# Patient Record
Sex: Female | Born: 1960 | Race: Black or African American | Hispanic: No | Smoking: Current every day smoker
Health system: Southern US, Community
[De-identification: ages and names within clinical notes are randomized; demographics above are authoritative.]

## PROBLEM LIST (undated history)

## (undated) DIAGNOSIS — I10 Essential (primary) hypertension: Secondary | ICD-10-CM

## (undated) DIAGNOSIS — R935 Abnormal findings on diagnostic imaging of other abdominal regions, including retroperitoneum: Secondary | ICD-10-CM

## (undated) DIAGNOSIS — K760 Fatty (change of) liver, not elsewhere classified: Secondary | ICD-10-CM

## (undated) DIAGNOSIS — L409 Psoriasis, unspecified: Secondary | ICD-10-CM

## (undated) DIAGNOSIS — Z9289 Personal history of other medical treatment: Secondary | ICD-10-CM

## (undated) DIAGNOSIS — J189 Pneumonia, unspecified organism: Secondary | ICD-10-CM

## (undated) DIAGNOSIS — N6009 Solitary cyst of unspecified breast: Secondary | ICD-10-CM

## (undated) DIAGNOSIS — K7689 Other specified diseases of liver: Secondary | ICD-10-CM

## (undated) DIAGNOSIS — R011 Cardiac murmur, unspecified: Secondary | ICD-10-CM

## (undated) DIAGNOSIS — K219 Gastro-esophageal reflux disease without esophagitis: Secondary | ICD-10-CM

## (undated) DIAGNOSIS — E119 Type 2 diabetes mellitus without complications: Secondary | ICD-10-CM

## (undated) DIAGNOSIS — N6002 Solitary cyst of left breast: Secondary | ICD-10-CM

## (undated) DIAGNOSIS — M1990 Unspecified osteoarthritis, unspecified site: Secondary | ICD-10-CM

## (undated) DIAGNOSIS — N6001 Solitary cyst of right breast: Secondary | ICD-10-CM

## (undated) DIAGNOSIS — E669 Obesity, unspecified: Secondary | ICD-10-CM

## (undated) HISTORY — DX: Fatty (change of) liver, not elsewhere classified: K76.0

## (undated) HISTORY — DX: Abnormal findings on diagnostic imaging of other abdominal regions, including retroperitoneum: R93.5

## (undated) HISTORY — DX: Solitary cyst of unspecified breast: N60.09

## (undated) HISTORY — DX: Unspecified osteoarthritis, unspecified site: M19.90

## (undated) HISTORY — DX: Type 2 diabetes mellitus without complications: E11.9

## (undated) HISTORY — DX: Solitary cyst of right breast: N60.02

## (undated) HISTORY — PX: COLONOSCOPY: SHX174

## (undated) HISTORY — DX: Pneumonia, unspecified organism: J18.9

## (undated) HISTORY — DX: Other specified diseases of liver: K76.89

## (undated) HISTORY — DX: Solitary cyst of left breast: N60.01

## (undated) HISTORY — DX: Gastro-esophageal reflux disease without esophagitis: K21.9

## (undated) HISTORY — DX: Psoriasis, unspecified: L40.9

## (undated) HISTORY — DX: Obesity, unspecified: E66.9

## (undated) HISTORY — PX: TONSILLECTOMY: SUR1361

---

## 1985-03-07 DIAGNOSIS — Z9289 Personal history of other medical treatment: Secondary | ICD-10-CM

## 1985-03-07 HISTORY — DX: Personal history of other medical treatment: Z92.89

## 1985-03-07 HISTORY — PX: OTHER SURGICAL HISTORY: SHX169

## 2008-07-10 ENCOUNTER — Emergency Department (HOSPITAL_COMMUNITY): Admission: EM | Admit: 2008-07-10 | Discharge: 2008-07-11 | Payer: Self-pay | Admitting: Emergency Medicine

## 2009-03-07 HISTORY — PX: ABDOMINAL HYSTERECTOMY: SHX81

## 2010-01-13 ENCOUNTER — Other Ambulatory Visit: Admission: RE | Admit: 2010-01-13 | Discharge: 2010-01-13 | Payer: Self-pay | Admitting: Obstetrics and Gynecology

## 2010-01-19 LAB — HM PAP SMEAR

## 2010-03-03 ENCOUNTER — Encounter (INDEPENDENT_AMBULATORY_CARE_PROVIDER_SITE_OTHER): Payer: Self-pay | Admitting: Obstetrics and Gynecology

## 2010-03-03 ENCOUNTER — Inpatient Hospital Stay (HOSPITAL_COMMUNITY)
Admission: RE | Admit: 2010-03-03 | Discharge: 2010-03-05 | Payer: Self-pay | Source: Home / Self Care | Attending: Obstetrics and Gynecology | Admitting: Obstetrics and Gynecology

## 2010-05-17 LAB — CBC
HCT: 35.9 % — ABNORMAL LOW (ref 36.0–46.0)
HCT: 37.9 % (ref 36.0–46.0)
Hemoglobin: 11.5 g/dL — ABNORMAL LOW (ref 12.0–15.0)
Hemoglobin: 12.7 g/dL (ref 12.0–15.0)
MCH: 30.4 pg (ref 26.0–34.0)
MCH: 30.9 pg (ref 26.0–34.0)
MCHC: 32 g/dL (ref 30.0–36.0)
MCV: 92.2 fL (ref 78.0–100.0)
RBC: 4.11 MIL/uL (ref 3.87–5.11)

## 2010-05-17 LAB — BASIC METABOLIC PANEL
CO2: 27 mEq/L (ref 19–32)
Chloride: 105 mEq/L (ref 96–112)
GFR calc Af Amer: >60 mL/min (ref >60)
Sodium: 140 mEq/L (ref 135–145)

## 2010-06-15 LAB — POCT I-STAT, CHEM 8
BUN: 7 mg/dL (ref 6–23)
Calcium, Ion: 1.16 mmol/L (ref 1.12–1.32)
Creatinine, Ser: 1 mg/dL (ref 0.4–1.2)
Sodium: 140 mEq/L (ref 135–145)
TCO2: 24 mmol/L (ref 0–100)

## 2010-06-15 LAB — URINALYSIS, ROUTINE W REFLEX MICROSCOPIC
Glucose, UA: NEGATIVE mg/dL
Hgb urine dipstick: NEGATIVE
Ketones, ur: NEGATIVE mg/dL
Protein, ur: NEGATIVE mg/dL

## 2012-05-23 ENCOUNTER — Other Ambulatory Visit: Payer: Self-pay | Admitting: Gastroenterology

## 2012-06-07 ENCOUNTER — Encounter (HOSPITAL_COMMUNITY): Payer: Self-pay | Admitting: *Deleted

## 2012-06-11 ENCOUNTER — Other Ambulatory Visit: Payer: Self-pay | Admitting: Internal Medicine

## 2012-06-11 DIAGNOSIS — K219 Gastro-esophageal reflux disease without esophagitis: Secondary | ICD-10-CM

## 2012-06-13 ENCOUNTER — Ambulatory Visit
Admission: RE | Admit: 2012-06-13 | Discharge: 2012-06-13 | Disposition: A | Discharge status: Home / Self Care | Payer: 59 | Source: Ambulatory Visit | Attending: Internal Medicine | Admitting: Internal Medicine

## 2012-06-13 ENCOUNTER — Encounter (HOSPITAL_COMMUNITY): Payer: Self-pay | Admitting: Pharmacy Technician

## 2012-06-13 DIAGNOSIS — K219 Gastro-esophageal reflux disease without esophagitis: Secondary | ICD-10-CM

## 2012-06-26 ENCOUNTER — Ambulatory Visit (HOSPITAL_COMMUNITY): Payer: 59 | Admitting: Anesthesiology

## 2012-06-26 ENCOUNTER — Encounter (HOSPITAL_COMMUNITY): Payer: Self-pay | Admitting: Anesthesiology

## 2012-06-26 ENCOUNTER — Ambulatory Visit (HOSPITAL_COMMUNITY)
Admission: RE | Admit: 2012-06-26 | Discharge: 2012-06-26 | Disposition: A | Discharge status: Home / Self Care | Payer: 59 | Source: Ambulatory Visit | Attending: Gastroenterology | Admitting: Gastroenterology

## 2012-06-26 ENCOUNTER — Encounter (HOSPITAL_COMMUNITY): Payer: Self-pay | Admitting: *Deleted

## 2012-06-26 ENCOUNTER — Encounter (HOSPITAL_COMMUNITY)
Admission: RE | Disposition: A | Discharge status: Home / Self Care | Payer: Self-pay | Source: Ambulatory Visit | Attending: Gastroenterology

## 2012-06-26 DIAGNOSIS — K219 Gastro-esophageal reflux disease without esophagitis: Secondary | ICD-10-CM | POA: Insufficient documentation

## 2012-06-26 DIAGNOSIS — K5730 Diverticulosis of large intestine without perforation or abscess without bleeding: Secondary | ICD-10-CM | POA: Insufficient documentation

## 2012-06-26 DIAGNOSIS — F17200 Nicotine dependence, unspecified, uncomplicated: Secondary | ICD-10-CM | POA: Insufficient documentation

## 2012-06-26 DIAGNOSIS — Z1211 Encounter for screening for malignant neoplasm of colon: Secondary | ICD-10-CM | POA: Insufficient documentation

## 2012-06-26 DIAGNOSIS — L408 Other psoriasis: Secondary | ICD-10-CM | POA: Insufficient documentation

## 2012-06-26 HISTORY — DX: Essential (primary) hypertension: I10

## 2012-06-26 HISTORY — PX: COLONOSCOPY WITH PROPOFOL: SHX5780

## 2012-06-26 HISTORY — DX: Cardiac murmur, unspecified: R01.1

## 2012-06-26 HISTORY — DX: Personal history of other medical treatment: Z92.89

## 2012-06-26 SURGERY — COLONOSCOPY WITH PROPOFOL
Anesthesia: Monitor Anesthesia Care

## 2012-06-26 MED ORDER — MIDAZOLAM HCL 5 MG/5ML IJ SOLN
INTRAMUSCULAR | Status: DC | PRN
  Administered 2012-06-26: 2 mg via INTRAVENOUS

## 2012-06-26 MED ORDER — LACTATED RINGERS IV SOLN
INTRAVENOUS | Status: DC
  Administered 2012-06-26: 1000 mL via INTRAVENOUS

## 2012-06-26 MED ORDER — PROPOFOL INFUSION 10 MG/ML OPTIME
INTRAVENOUS | Status: DC | PRN
  Administered 2012-06-26: 140 ug/kg/min via INTRAVENOUS

## 2012-06-26 MED ORDER — SODIUM CHLORIDE 0.9 % IV SOLN: INTRAVENOUS | Status: DC

## 2012-06-26 SURGICAL SUPPLY — 21 items

## 2012-06-26 NOTE — Anesthesia Preprocedure Evaluation (Addendum)
Anesthesia Evaluation  Patient identified by MRN, date of birth, ID band Patient awake    Reviewed: Allergy & Precautions, H&P , NPO status , Patient's Chart, lab work & pertinent test results  Airway Mallampati: II TM Distance: >3 FB Neck ROM: full    Dental  (+) Dental Advisory Given and Missing Missing a couple of lateral front teeth.:   Pulmonary neg pulmonary ROS, Current Smoker,  breath sounds clear to auscultation  Pulmonary exam normal       Cardiovascular Exercise Tolerance: Good hypertension, Pt. on medications negative cardio ROS  Rhythm:regular Rate:Normal     Neuro/Psych negative neurological ROS  negative psych ROS   GI/Hepatic negative GI ROS, Neg liver ROS,   Endo/Other  negative endocrine ROS  Renal/GU negative Renal ROS  negative genitourinary   Musculoskeletal   Abdominal   Peds  Hematology negative hematology ROS (+)   Anesthesia Other Findings   Reproductive/Obstetrics negative OB ROS                          Anesthesia Physical Anesthesia Plan  ASA: II  Anesthesia Plan: MAC   Post-op Pain Management:    Induction:   Airway Management Planned: Simple Face Mask  Additional Equipment:   Intra-op Plan:   Post-operative Plan:   Informed Consent: I have reviewed the patients History and Physical, chart, labs and discussed the procedure including the risks, benefits and alternatives for the proposed anesthesia with the patient or authorized representative who has indicated his/her understanding and acceptance.   Dental Advisory Given  Plan Discussed with: CRNA and Surgeon  Anesthesia Plan Comments:         Anesthesia Quick Evaluation

## 2012-06-26 NOTE — Op Note (Signed)
Procedure: Screening colonoscopy  Endoscopist: Martin Johnson  Premedication: Propofol administered by anesthesia  Procedure: The patient was placed in the left lateral decubitus position. Anal inspection and digital rectal exam were normal. The Pentax pediatric colonoscope was introduced into the rectum and advanced to the cecum. A normal-appearing ileocecal valve and appendiceal orifice were identified. Colonic preparation for the exam today was good.  Rectum. Normal. Retroflex view of the distal rectum normal.  Sigmoid colon and descending colon. Left colonic diverticulosis.  Splenic flexure. Normal.  Transverse colon. Normal.  Hepatic flexure. Normal.  Ascending colon. Normal.  Cecum and ileocecal valve. Normal.  Assessment: Normal screening proctocolonoscopy to the cecum.  Recommendations: Schedule repeat screening colonoscopy in 10 years. 

## 2012-06-26 NOTE — Anesthesia Postprocedure Evaluation (Signed)
  Anesthesia Post-op Note  Patient: Judy Fischer  Procedure(s) Performed: Procedure(s) (LRB): COLONOSCOPY WITH PROPOFOL (N/A)  Patient Location: PACU  Anesthesia Type: MAC  Level of Consciousness: awake and alert   Airway and Oxygen Therapy: Patient Spontanous Breathing  Post-op Pain: mild  Post-op Assessment: Post-op Vital signs reviewed, Patient's Cardiovascular Status Stable, Respiratory Function Stable, Patent Airway and No signs of Nausea or vomiting  Last Vitals:  Filed Vitals:   06/26/12 1258  BP: 156/80  Pulse:   Temp:   Resp: 15    Post-op Vital Signs: stable   Complications: No apparent anesthesia complications

## 2012-06-26 NOTE — Transfer of Care (Signed)
Immediate Anesthesia Transfer of Care Note  Patient: Judy Fischer  Procedure(s) Performed: Procedure(s): COLONOSCOPY WITH PROPOFOL (N/A)  Patient Location: PACU  Anesthesia Type:MAC  Level of Consciousness: awake, alert , oriented and patient cooperative  Airway & Oxygen Therapy: Patient Spontanous Breathing and Patient connected to nasal cannula oxygen  Post-op Assessment: Report given to PACU RN and Post -op Vital signs reviewed and stable  Post vital signs: Reviewed and stable  Complications: No apparent anesthesia complications

## 2012-06-26 NOTE — Preoperative (Signed)
Beta Blockers   Reason not to administer Beta Blockers:Not Applicable 

## 2012-06-26 NOTE — H&P (Signed)
Procedure: Screening colonoscopy.  History: The patient is a 52 year old female born Aug 16, 1960. The patient is scheduled to undergo her first screening colonoscopy with polypectomy to prevent colon cancer.  Chronic medications: Hydrochlorothiazide. Buspirone.  Past medical history: Gastroesophageal reflux. Psoriasis. Laparotomy for tubal pregnancy. Total abdominal hysterectomy. Bilateral salpingo-oophorectomy.  Medication allergies: None  Habits: The patient smokes one pack of cigarettes per day and consumes alcohol in moderation.  Exam: The patient is alert and lying comfortably on the endoscopy stretcher. Lungs are clear to auscultation. Cardiac exam reveals a regular rhythm. Abdomen is soft, flat, and nontender to palpation.  Plan: Proceed with screening colonoscopy using propofol sedation.

## 2012-06-27 ENCOUNTER — Encounter (HOSPITAL_COMMUNITY): Payer: Self-pay | Admitting: Gastroenterology

## 2012-07-12 ENCOUNTER — Other Ambulatory Visit: Payer: Self-pay | Admitting: Internal Medicine

## 2012-07-12 DIAGNOSIS — R1011 Right upper quadrant pain: Secondary | ICD-10-CM

## 2012-07-16 ENCOUNTER — Ambulatory Visit
Admission: RE | Admit: 2012-07-16 | Discharge: 2012-07-16 | Disposition: A | Discharge status: Home / Self Care | Payer: 59 | Source: Ambulatory Visit | Attending: Internal Medicine | Admitting: Internal Medicine

## 2012-07-16 DIAGNOSIS — R1011 Right upper quadrant pain: Secondary | ICD-10-CM

## 2012-07-19 ENCOUNTER — Other Ambulatory Visit: Payer: 59

## 2012-08-06 ENCOUNTER — Other Ambulatory Visit: Payer: Self-pay | Admitting: Gastroenterology

## 2012-08-06 DIAGNOSIS — R109 Unspecified abdominal pain: Secondary | ICD-10-CM

## 2012-08-13 ENCOUNTER — Ambulatory Visit
Admission: RE | Admit: 2012-08-13 | Discharge: 2012-08-13 | Disposition: A | Discharge status: Home / Self Care | Payer: 59 | Source: Ambulatory Visit | Attending: Gastroenterology | Admitting: Gastroenterology

## 2012-08-13 DIAGNOSIS — R935 Abnormal findings on diagnostic imaging of other abdominal regions, including retroperitoneum: Secondary | ICD-10-CM

## 2012-08-13 DIAGNOSIS — R109 Unspecified abdominal pain: Secondary | ICD-10-CM

## 2012-08-13 HISTORY — DX: Abnormal findings on diagnostic imaging of other abdominal regions, including retroperitoneum: R93.5

## 2012-08-13 MED ORDER — GADOXETATE DISODIUM 0.25 MMOL/ML IV SOLN
10.0000 mL | Freq: Once | INTRAVENOUS | Status: AC | PRN
  Administered 2012-08-13: 10 mL via INTRAVENOUS

## 2013-01-04 ENCOUNTER — Other Ambulatory Visit: Payer: Self-pay

## 2013-01-04 DIAGNOSIS — Z1231 Encounter for screening mammogram for malignant neoplasm of breast: Secondary | ICD-10-CM

## 2013-02-04 ENCOUNTER — Ambulatory Visit: Payer: 59

## 2013-02-04 ENCOUNTER — Ambulatory Visit
Admission: RE | Admit: 2013-02-04 | Discharge: 2013-02-04 | Disposition: A | Discharge status: Home / Self Care | Payer: 59 | Source: Ambulatory Visit

## 2013-02-04 DIAGNOSIS — Z1231 Encounter for screening mammogram for malignant neoplasm of breast: Secondary | ICD-10-CM

## 2013-02-05 ENCOUNTER — Other Ambulatory Visit: Payer: Self-pay | Admitting: Internal Medicine

## 2013-02-05 DIAGNOSIS — R928 Other abnormal and inconclusive findings on diagnostic imaging of breast: Secondary | ICD-10-CM

## 2013-02-07 ENCOUNTER — Other Ambulatory Visit: Payer: Self-pay | Admitting: Internal Medicine

## 2013-02-07 ENCOUNTER — Ambulatory Visit
Admission: RE | Admit: 2013-02-07 | Discharge: 2013-02-07 | Disposition: A | Discharge status: Home / Self Care | Payer: 59 | Source: Ambulatory Visit | Attending: Internal Medicine | Admitting: Internal Medicine

## 2013-02-07 DIAGNOSIS — M25561 Pain in right knee: Secondary | ICD-10-CM

## 2013-02-07 DIAGNOSIS — R928 Other abnormal and inconclusive findings on diagnostic imaging of breast: Secondary | ICD-10-CM

## 2013-02-14 ENCOUNTER — Other Ambulatory Visit: Payer: 59

## 2013-06-05 ENCOUNTER — Other Ambulatory Visit: Payer: Self-pay | Admitting: Internal Medicine

## 2013-06-05 ENCOUNTER — Ambulatory Visit
Admission: RE | Admit: 2013-06-05 | Discharge: 2013-06-05 | Disposition: A | Discharge status: Home / Self Care | Payer: 59 | Source: Ambulatory Visit | Attending: Internal Medicine | Admitting: Internal Medicine

## 2013-06-05 DIAGNOSIS — R062 Wheezing: Secondary | ICD-10-CM

## 2013-07-04 ENCOUNTER — Encounter: Payer: Self-pay | Admitting: Internal Medicine

## 2013-07-04 ENCOUNTER — Other Ambulatory Visit: Payer: Self-pay | Admitting: Gastroenterology

## 2013-07-30 ENCOUNTER — Encounter: Payer: 59 | Attending: Internal Medicine

## 2013-07-30 VITALS — Ht 63.0 in | Wt 250.9 lb

## 2013-07-30 DIAGNOSIS — E119 Type 2 diabetes mellitus without complications: Secondary | ICD-10-CM | POA: Insufficient documentation

## 2013-07-30 DIAGNOSIS — Z713 Dietary counseling and surveillance: Secondary | ICD-10-CM | POA: Insufficient documentation

## 2013-08-06 ENCOUNTER — Encounter: Payer: 59 | Attending: Internal Medicine

## 2013-08-06 DIAGNOSIS — Z713 Dietary counseling and surveillance: Secondary | ICD-10-CM | POA: Insufficient documentation

## 2013-08-06 DIAGNOSIS — E119 Type 2 diabetes mellitus without complications: Secondary | ICD-10-CM | POA: Insufficient documentation

## 2013-08-06 NOTE — Progress Notes (Signed)
Patient was seen on 07/30/13 for the first of a series of three diabetes self-management courses at the [facility].  Current HbA1c: 10.6%  The following learning objectives were met by the patient during this class:  Describe diabetes  State some common risk factors for diabetes  Defines the role of glucose and insulin  Identifies type of diabetes and pathophysiology  Describe the relationship between diabetes and cardiovascular risk  State the members of the Healthcare Team  States the rationale for glucose monitoring  State when to test glucose  State their individual Target Range  State the importance of logging glucose readings  Describe how to interpret glucose readings  Identifies A1C target  Explain the correlation between A1c and eAG values  State symptoms and treatment of high blood glucose  State symptoms and treatment of low blood glucose  Explain proper technique for glucose testing  Identifies proper sharps disposal  Handouts given during class include:  Living Well with Diabetes book  Carb Counting and Meal Planning book  Meal Plan Card  Carbohydrate guide  Meal planning worksheet  Low Sodium Flavoring Tips  The diabetes portion plate  J0Z to eAG Conversion Chart  Diabetes Medications  Diabetes Recommended Care Schedule  Support Group  Diabetes Success Plan  Core Class Satisfaction Survey  Follow-Up Plan:  Attend core 2

## 2013-08-07 NOTE — Progress Notes (Signed)

## 2013-08-13 DIAGNOSIS — E119 Type 2 diabetes mellitus without complications: Secondary | ICD-10-CM

## 2013-08-13 NOTE — Progress Notes (Signed)
Patient was seen on 08/13/2013 for the third of a series of three diabetes self-management courses at the [facility]. The following learning objectives were met by the patient during this class:    State the amount of activity recommended for healthy living   Describe activities suitable for individual needs   Identify ways to regularly incorporate activity into daily life   Identify barriers to activity and ways to over come these barriers  Identify diabetes medications being personally used and their primary action for lowering glucose and possible side effects   Describe role of stress on blood glucose and develop strategies to address psychosocial issues   Identify diabetes complications and ways to prevent them  Explain how to manage diabetes during illness   Evaluate success in meeting personal goal   Establish 2-3 goals that they will plan to diligently work on until they return for the  11-monthfollow-up visit  Goals:  Follow Diabetes Meal Plan as instructed  Aim for 15-30 mins of physical activity daily as tolerated  Bring food record and glucose log to your follow up visit  Your patient has established the following 4 month goals in their individualized success plan: I will increase my activity level at least 3 days a week  Your patient has identified these potential barriers to change:  None stated  Your patient has identified their diabetes self-care support plan as  [facility]  Family Support  Plan:  Attend Core 4 in 4 months

## 2013-11-04 IMAGING — US US ABDOMEN LIMITED
1 series · 14 of 25 positions shown · non-contrast
Comparison: None.

CLINICAL DATA: Right upper quadrant pain.

LIMITED ABDOMINAL ULTRASOUND - RIGHT UPPER QUADRANT

[Series 1: us abdomen limited · 0.35mm/px · 14 of 57 slices shown]
[im 1/57]
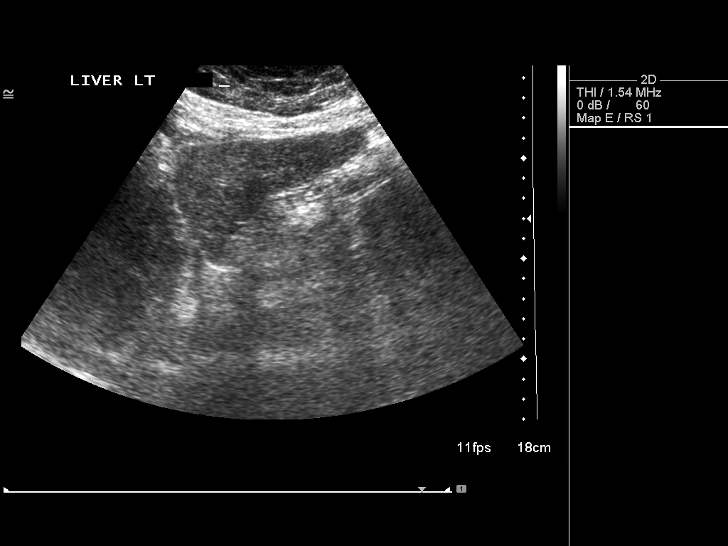
[im 5/57]
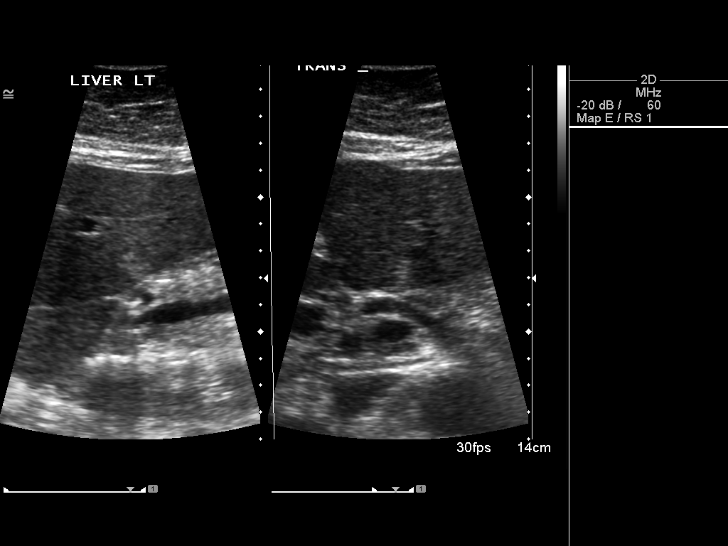
[im 10/57]
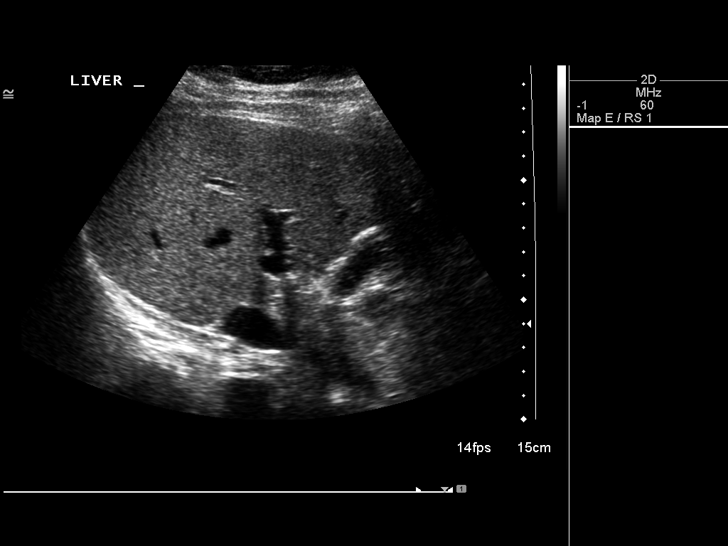
[im 15/57]
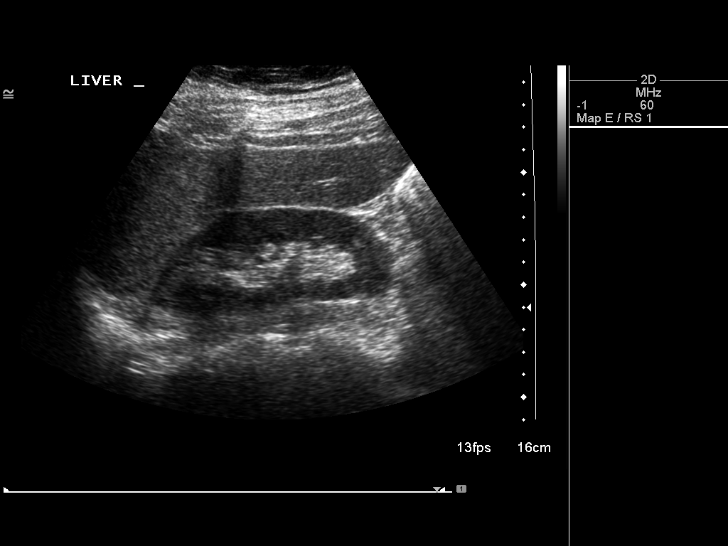
[im 19/57]
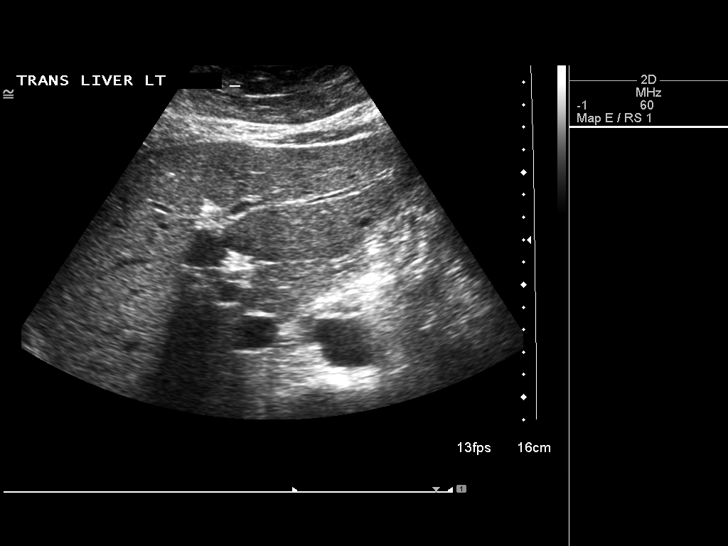
[im 22/57]
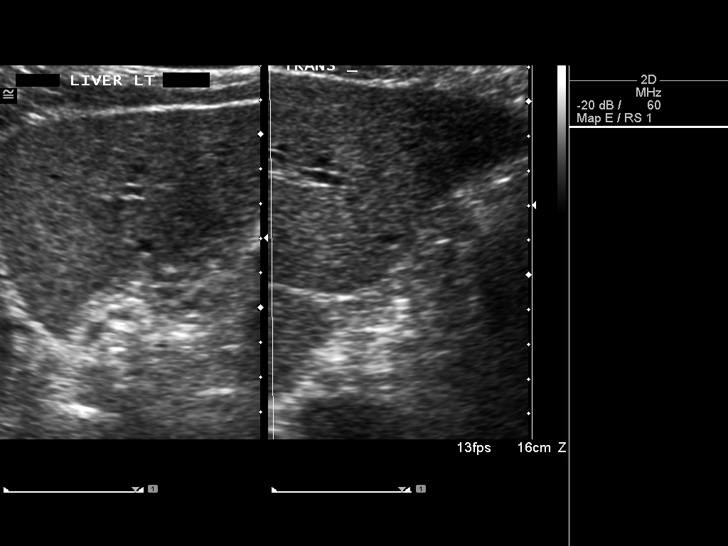
[im 26/57]
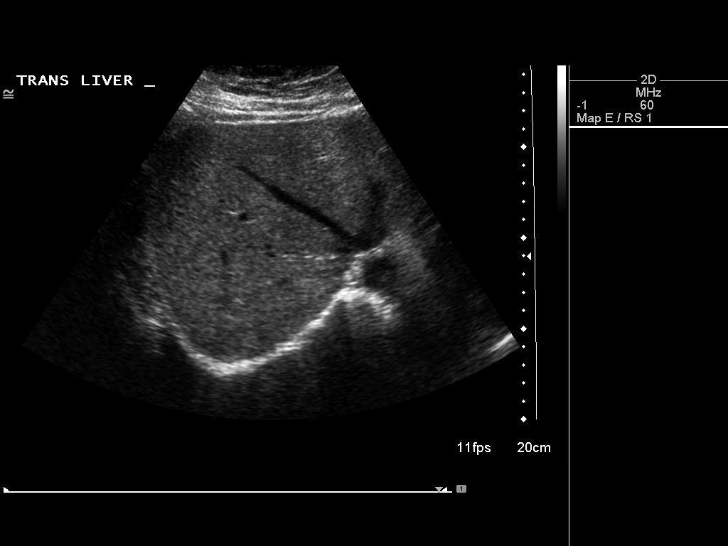
[im 31/57]
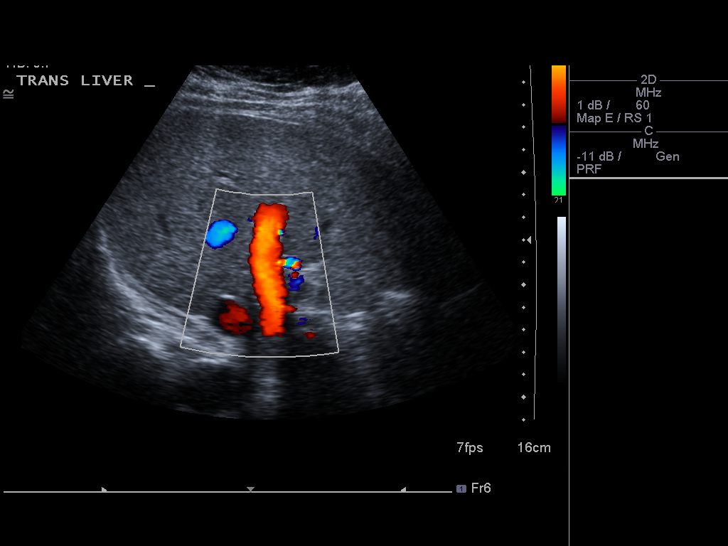
[im 36/57]
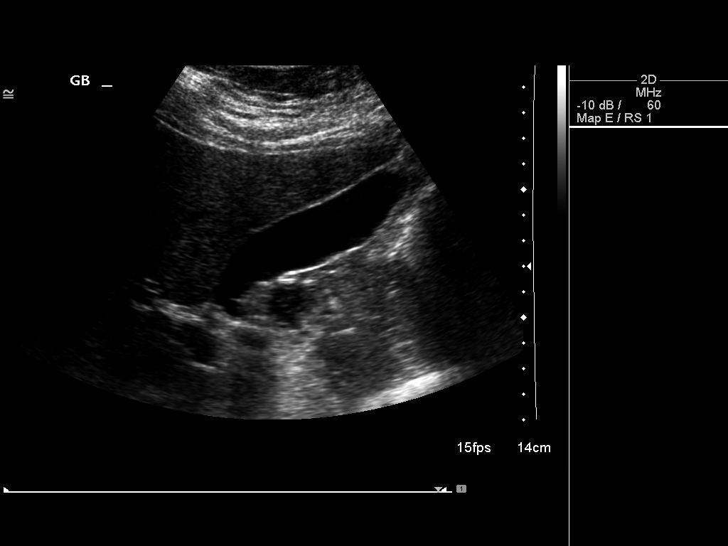
[im 38/57]
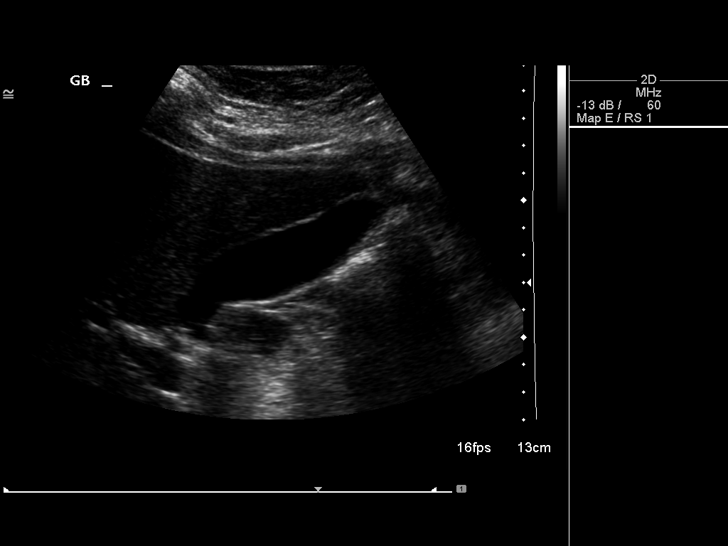
[im 43/57]
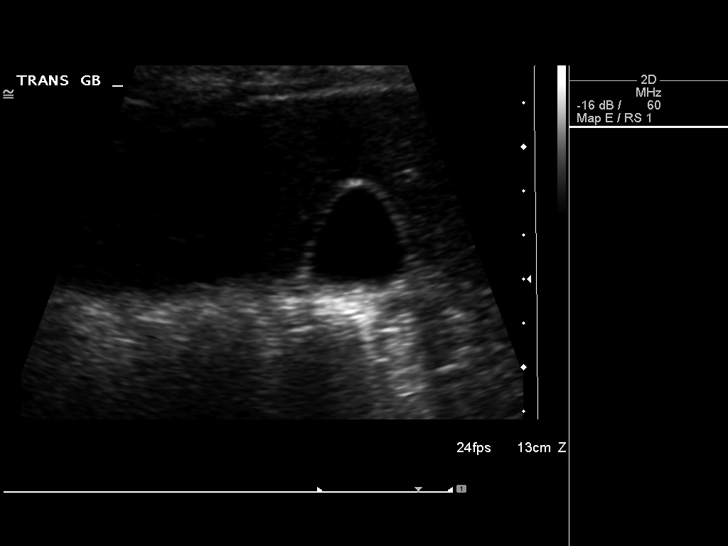
[im 47/57]
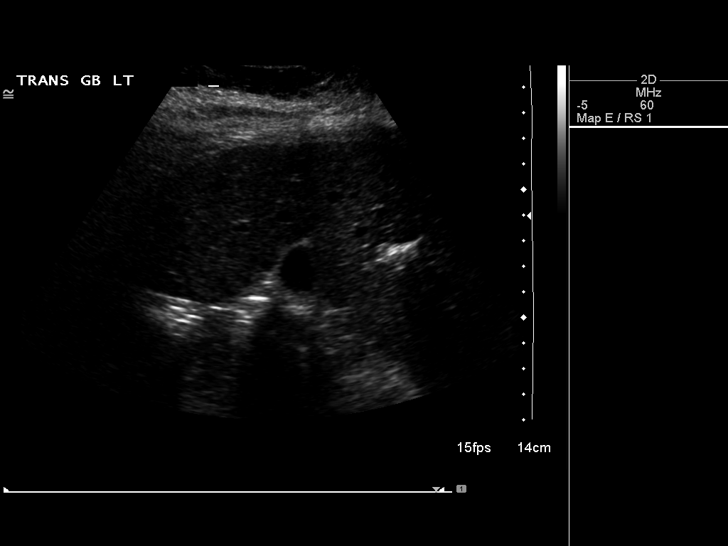
[im 52/57]
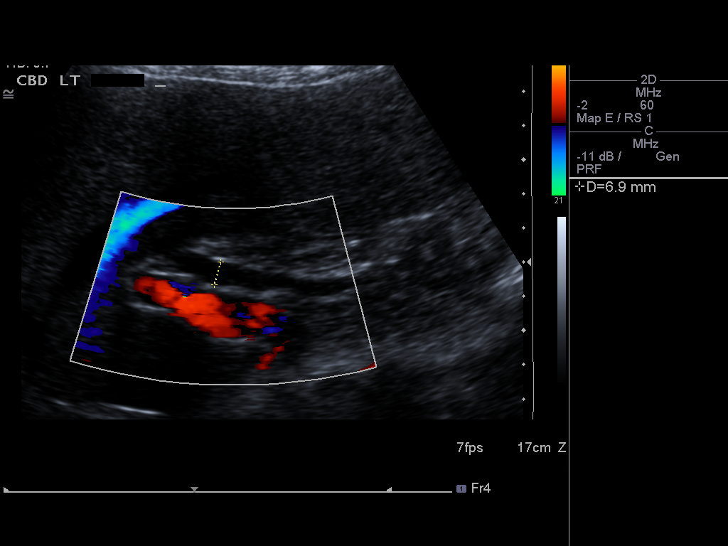
[im 57/57]
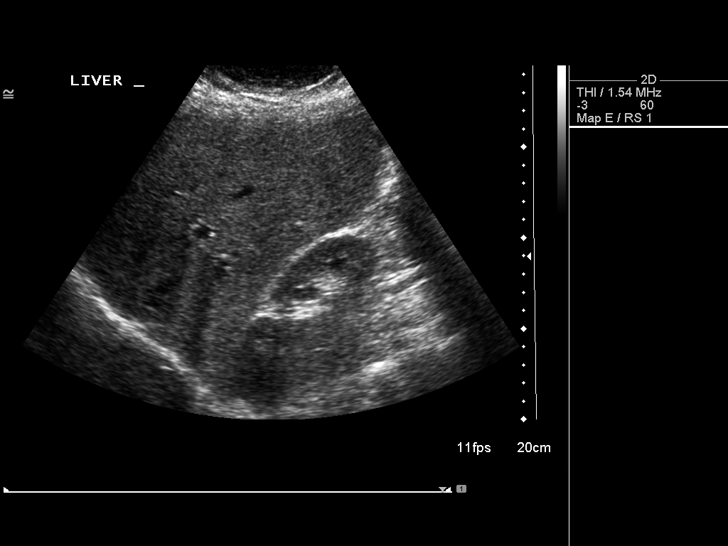

[14 of 25 positions shown; findings below may reference images not displayed]

FINDINGS: Gallbladder:  No gallstones, wall thickening or sonographic
Murphy's sign.

Common bile duct:  Measures 7 mm, prominent for age.  No
intrahepatic biliary duct dilatation.

Liver:  Appears mildly increased in echogenicity diffusely.  There
is a probable tiny cyst in the left hepatic lobe, measuring 6 mm.
A focal area of increased echogenicity is seen in the left hepatic
lobe, measuring 1.4 x 1.1 x 1.0 cm.  Measures up to approximately
17.6 cm.
IMPRESSION: 1.  Borderline enlarged liver with steatosis.
2.  Focal hypoechoic area in the left hepatic lobe. In the absence
of known malignancy, differential diagnosis includes focal fat and
hemangioma.
3.  Mildly prominent common bile duct without corresponding
intrahepatic biliary duct dilatation.

## 2013-12-09 ENCOUNTER — Ambulatory Visit: Payer: 59

## 2014-04-08 ENCOUNTER — Other Ambulatory Visit: Payer: Self-pay

## 2014-04-08 DIAGNOSIS — Z1231 Encounter for screening mammogram for malignant neoplasm of breast: Secondary | ICD-10-CM

## 2014-04-21 ENCOUNTER — Ambulatory Visit: Payer: Self-pay

## 2014-04-21 ENCOUNTER — Ambulatory Visit: Payer: Self-pay | Admitting: Podiatry

## 2015-02-12 ENCOUNTER — Ambulatory Visit
Admission: RE | Admit: 2015-02-12 | Discharge: 2015-02-12 | Disposition: A | Discharge status: Home / Self Care | Payer: Commercial Managed Care - HMO | Source: Ambulatory Visit

## 2015-02-12 DIAGNOSIS — Z1231 Encounter for screening mammogram for malignant neoplasm of breast: Secondary | ICD-10-CM

## 2016-02-02 ENCOUNTER — Other Ambulatory Visit: Payer: Self-pay | Admitting: Internal Medicine

## 2016-02-02 DIAGNOSIS — Z1231 Encounter for screening mammogram for malignant neoplasm of breast: Secondary | ICD-10-CM

## 2016-02-24 ENCOUNTER — Ambulatory Visit: Payer: Commercial Managed Care - HMO

## 2017-03-28 ENCOUNTER — Ambulatory Visit: Payer: Self-pay | Admitting: Internal Medicine

## 2017-03-31 ENCOUNTER — Ambulatory Visit
Admission: RE | Admit: 2017-03-31 | Discharge: 2017-03-31 | Disposition: A | Discharge status: Home / Self Care | Payer: 59 | Source: Ambulatory Visit | Attending: Internal Medicine | Admitting: Internal Medicine

## 2017-03-31 ENCOUNTER — Encounter: Payer: Self-pay | Admitting: Internal Medicine

## 2017-03-31 ENCOUNTER — Ambulatory Visit (INDEPENDENT_AMBULATORY_CARE_PROVIDER_SITE_OTHER): Payer: 59 | Admitting: Internal Medicine

## 2017-03-31 VITALS — BP 126/80 | HR 74 | Temp 98.2°F | Ht 63.0 in | Wt 251.0 lb

## 2017-03-31 DIAGNOSIS — E119 Type 2 diabetes mellitus without complications: Secondary | ICD-10-CM | POA: Insufficient documentation

## 2017-03-31 DIAGNOSIS — M25561 Pain in right knee: Secondary | ICD-10-CM | POA: Diagnosis not present

## 2017-03-31 DIAGNOSIS — E1159 Type 2 diabetes mellitus with other circulatory complications: Secondary | ICD-10-CM | POA: Diagnosis not present

## 2017-03-31 DIAGNOSIS — Z794 Long term (current) use of insulin: Secondary | ICD-10-CM

## 2017-03-31 DIAGNOSIS — M25462 Effusion, left knee: Secondary | ICD-10-CM | POA: Insufficient documentation

## 2017-03-31 DIAGNOSIS — I10 Essential (primary) hypertension: Secondary | ICD-10-CM

## 2017-03-31 DIAGNOSIS — I152 Hypertension secondary to endocrine disorders: Secondary | ICD-10-CM

## 2017-03-31 DIAGNOSIS — G8929 Other chronic pain: Secondary | ICD-10-CM

## 2017-03-31 DIAGNOSIS — J309 Allergic rhinitis, unspecified: Secondary | ICD-10-CM | POA: Insufficient documentation

## 2017-03-31 DIAGNOSIS — Z720 Tobacco use: Secondary | ICD-10-CM

## 2017-03-31 MED ORDER — DICLOFENAC SODIUM 1 % TD GEL
4.0000 g | Freq: Four times a day (QID) | TRANSDERMAL | 6 refills | Status: DC
Start: 2017-03-31 — End: 2017-04-03

## 2017-03-31 NOTE — Progress Notes (Signed)
Patient ID: Judy Fischer, female   DOB: 10-Aug-1960, 57 y.o.   MRN: [id number]   Location:  [facility] OFFICE  Provider: DR Arletha Grippe  Code Status:  Goals of Care:  Advanced Directives 08/06/2013  Does Patient Have a Medical Advance Directive? Patient does not have advance directive;Patient would not like information  Pre-existing out of facility DNR order (yellow Fischer or pink MOST Fischer) No     Chief Complaint  Patient presents with  . Establish Care    new patient    HPI: Patient is a 57 y.o. female seen today as a new pt. She c/o 1 month hx right knee pain. No known injury. Pain is throbbing (occasionaly shooting sharp) and has occasionally caused knee to give away. Pain is 8-9/10 on scale. She reports being dx "years ago" with knee arthritis. She has never seen Ortho. Sleep not interrupted 2/2 Tylenol PM. No numbness or tingling.  She noticed "white thing" floating in OU intermittently x 1 month. (+) left sided HA associated with it. Last eye exam 2-3 yrs ago.  Allergic rhinitis - uses flonase prn (at least 2 times per week) and benadryl. She has not tried Rx antihistamine  HTN - stable on HCTZ. Takes ASA daily  DM - dx in 2015. Metformin caused severe nausea. She takes levemir 10 units daily. She has never seen a endocrinologist but has seen diabetic educator.  Last A1c unknown. She does not check BS consistently at home.   Tobacco abuse - she smokes 1 ppd x 35 yrs.  Hx psoriasis - stable with topical cocoa butter  Past Medical History:  Diagnosis Date  . Benign breast cyst in female    Need yearly mammograms, noted from [facility]   . Diabetes ([place])   . Diabetes mellitus without complication ([place])   . Fatty liver    Noted in records received from [facility]   . GERD (gastroesophageal reflux disease)   . Heart murmur    mild  . History of blood transfusion 1987  . Hypertension   . Obesity   . Pneumonia   . Psoriasis    Noted in records received  from [facility]     Past Surgical History:  Procedure Laterality Date  . ABDOMINAL HYSTERECTOMY  2011   ovaries removed  . COLONOSCOPY WITH PROPOFOL N/A 06/26/2012   Procedure: COLONOSCOPY WITH PROPOFOL;  Surgeon: Garlan Fair, MD;  Location: [facility];  Service: Endoscopy;  Laterality: N/A;  . surgery for ectopic pregnancy  1987  . TONSILLECTOMY  as child     reports that she has been smoking cigarettes.  She has a 35.00 pack-year smoking history. she has never used smokeless tobacco. She reports that she does not drink alcohol or use drugs. Social History   Socioeconomic History  . Marital status: Divorced    Spouse name: Not on file  . Number of children: Not on file  . Years of education: Not on file  . Highest education level: Not on file  Social Needs  . Financial resource strain: Not on file  . Food insecurity - worry: Not on file  . Food insecurity - inability: Not on file  . Transportation needs - medical: Not on file  . Transportation needs - non-medical: Not on file  Occupational History  . Not on file  Tobacco Use  . Smoking status: Current Every Day Smoker    Packs/day: 1.00    Years: 35.00    Pack  years: 35.00    Types: Cigarettes  . Smokeless tobacco: Never Used  Substance and Sexual Activity  . Alcohol use: No  . Drug use: No  . Sexual activity: Not Currently  Other Topics Concern  . Not on file  Social History Narrative   Social History      Diet?       Do you drink/eat things with caffeine? yes      Marital status?            divorced                        What year were you married? 1985      Do you live in a house, apartment, assisted living, condo, trailer, etc.? apartment      Is it one or more stories? 1      How many persons live in your home? 1      Do you have any pets in your home? (please list) yes      Highest level of education completed? masters      Current or past profession:      Do you exercise?           yes                            Type & how often? Walk daily      Advanced Directives      Do you have a living will? no      Do you have a DNR Fischer?       no                           If not, do you want to discuss one?      Do you have signed POA/HPOA for forms? no      Functional Status      Do you have difficulty bathing or dressing yourself?      Do you have difficulty preparing food or eating?       Do you have difficulty managing your medications?      Do you have difficulty managing your finances?      Do you have difficulty affording your medications?    Family History  Problem Relation Age of Onset  . Diabetes Other   . Hypertension Other   . Diabetes Mother   . Hypertension Mother   . Heart disease Mother   . Cancer Father   . Diabetes Brother   . Diabetes Brother     Allergies  Allergen Reactions  . Metformin And Related Nausea Only    Outpatient Encounter Medications as of 03/31/2017  Medication Sig  . albuterol (PROVENTIL HFA;VENTOLIN HFA) 108 (90 BASE) MCG/ACT inhaler Inhale into the lungs every 6 (six) hours as needed for wheezing or shortness of breath.  Marland Kitchen aspirin 81 MG tablet Take 81 mg by mouth daily.  Marland Kitchen dextromethorphan-guaiFENesin (MUCINEX DM) 30-600 MG 12hr tablet Take 1 tablet by mouth 2 (two) times daily.  . diphenhydramine-acetaminophen (TYLENOL PM) 25-500 MG TABS tablet Take 1 tablet by mouth at bedtime as needed.  . fluticasone (FLONASE) 50 MCG/ACT nasal spray Place 1 spray into both nostrils daily.  . hydrochlorothiazide (HYDRODIURIL) 25 MG tablet Take 25 mg by mouth daily.  . insulin detemir (LEVEMIR) 100 UNIT/ML injection Inject 10 Units into the skin at bedtime.  Marland Kitchen  Insulin Pen Needle (NOVOFINE) 32G X 6 MM MISC by Does not apply route.  . [DISCONTINUED] lisinopril (PRINIVIL,ZESTRIL) 2.5 MG tablet Take 2.5 mg by mouth every morning.  . [DISCONTINUED] omeprazole (PRILOSEC) 40 MG capsule Take 40 mg by mouth daily.  . [DISCONTINUED]  sitaGLIPtin (JANUVIA) 100 MG tablet Take 100 mg by mouth daily.   No facility-administered encounter medications on file as of 03/31/2017.     Review of Systems:  Review of Systems  HENT:       Dry mouth  Musculoskeletal: Positive for arthralgias and joint swelling (and stiffness).  Skin: Positive for rash.       Itch; nail abnormality  Allergic/Immunologic: Positive for environmental allergies.  All other systems reviewed and are negative.   Health Maintenance  Topic Date Due  . Hepatitis C Screening  June 05, 1960  . HIV Screening  02/12/1977  . PAP SMEAR  02/13/1983  . MAMMOGRAM  02/11/2017  . COLONOSCOPY  06/27/2022  . TETANUS/TDAP  04/06/2025  . INFLUENZA VACCINE  Completed    Physical Exam: Vitals:   03/31/17 0837  BP: 126/80  Pulse: 74  Temp: 98.2 F (36.8 C)  TempSrc: Oral  SpO2: 94%  Weight: 251 lb (113.9 kg)  Height: '5\' 3"'$  (1.6 m)   Body mass index is 44.46 kg/m. Physical Exam  Constitutional: She is oriented to person, place, and time. She appears well-developed and well-nourished.  HENT:  Mouth/Throat: Oropharynx is clear and moist. No oropharyngeal exudate.  MMM; no oral thrush  Eyes: Pupils are equal, round, and reactive to light. No scleral icterus.  Neck: Neck supple. Carotid bruit is not present. No tracheal deviation present. No thyromegaly present.  Cardiovascular: Normal rate, regular rhythm and intact distal pulses. Exam reveals no gallop and no friction rub.  Murmur (1/6 SEM) heard. Left calf with palpable NT varicosities; trace LE swelling.  Pulmonary/Chest: Effort normal. No stridor. No respiratory distress. She has wheezes (end expiratory with prolonged expiratory phase). She has no rales.  Abdominal: Soft. Normal appearance and bowel sounds are normal. She exhibits no distension and no mass. There is no hepatomegaly. There is no tenderness. There is no rigidity, no rebound and no guarding. No hernia.  obese  Musculoskeletal: She exhibits  edema and tenderness.  R>L knee swelling with L>R grinding with ROM; neg Drawer test; neg McMurray test; strength intact  Lymphadenopathy:    She has no cervical adenopathy.  Neurological: She is alert and oriented to person, place, and time.  Skin: Skin is warm and dry. Rash (psoriatic plaques) noted.  Psychiatric: She has a normal mood and affect. Her behavior is normal. Judgment and thought content normal.    Labs reviewed: Basic Metabolic Panel: No results for input(s): NA, K, CL, CO2, GLUCOSE, BUN, CREATININE, CALCIUM, MG, PHOS, TSH in the last 8760 hours. Liver Function Tests: No results for input(s): AST, ALT, ALKPHOS, BILITOT, PROT, ALBUMIN in the last 8760 hours. No results for input(s): LIPASE, AMYLASE in the last 8760 hours. No results for input(s): AMMONIA in the last 8760 hours. CBC: No results for input(s): WBC, NEUTROABS, HGB, HCT, MCV, PLT in the last 8760 hours. Lipid Panel: No results for input(s): CHOL, HDL, LDLCALC, TRIG, CHOLHDL, LDLDIRECT in the last 8760 hours. No results found for: HGBA1C  Procedures since last visit: No results found.  Assessment/Plan   ICD-10-CM   1. Chronic pain of right knee M25.561 DG Knee Complete 4 Views Right   G89.29 diclofenac sodium (VOLTAREN) 1 % GEL  2. Swelling  of joint, knee, left M25.462 DG Knee Complete 4 Views Left    diclofenac sodium (VOLTAREN) 1 % GEL  3. Type 2 diabetes mellitus without complication, with long-term current use of insulin (HCC) E11.9 CMP with eGFR([facility])   Z79.4 Lipid Panel    TSH    Urinalysis with Reflex Microscopic    Microalbumin/Creatinine Ratio, Urine    Hemoglobin A1c  4. Allergic rhinitis, unspecified seasonality, unspecified trigger J30.9   5. Continuous tobacco abuse Z72.0   6. Hypertension associated with diabetes ([place]) E11.59 CBC with Differential/Platelets   I10 TSH   Get old records  Smoking cessation discussed and highly urged - no plans to quit at this time  Will need  ophthamology referral for diabetic eye exam - she prefers to wait at this time   Will call with imaging results  Will call with lab results  Continue current medications as ordered  START VOLTAREN GEL (DICLOFENAC) APPLY TO KNEES UP TO 4 TIMES DAILY FOR PAIN AND SWELLING  Follow up in 1 month for CPE/ECG   Mael Delap S. Perlie Gold  [facility] [address] [place], [place] [address] [phone] Cell (Monday-Friday 8 AM - 5 PM) [phone] After 5 PM and follow prompts

## 2017-03-31 NOTE — Patient Instructions (Signed)
Will call with imaging results  Will call with lab results  Continue current medications as ordered  START VOLTAREN GEL (DICLOFENAC) APPLY TO KNEES UP TO 4 TIMES DAILY FOR PAIN AND SWELLING  Follow up in 1 month for CPE/ECG

## 2017-04-01 LAB — CBC WITH DIFFERENTIAL/PLATELET
Basophils Absolute: 47 cells/uL (ref 0–200)
Basophils Relative: 0.5 %
EOS PCT: 3.3 %
Eosinophils Absolute: 310 cells/uL (ref 15–500)
HEMATOCRIT: 46.3 % — AB (ref 35.0–45.0)
HEMOGLOBIN: 15.4 g/dL (ref 11.7–15.5)
LYMPHS ABS: 3563 {cells}/uL (ref 850–3900)
MCH: 30.3 pg (ref 27.0–33.0)
MCHC: 33.3 g/dL (ref 32.0–36.0)
MCV: 91 fL (ref 80.0–100.0)
MPV: 12.6 fL — ABNORMAL HIGH (ref 7.5–12.5)
Monocytes Relative: 5.7 %
NEUTROS ABS: 4944 {cells}/uL (ref 1500–7800)
Neutrophils Relative %: 52.6 %
Platelets: 233 10*3/uL (ref 140–400)
RBC: 5.09 10*6/uL (ref 3.80–5.10)
RDW: 13 % (ref 11.0–15.0)
Total Lymphocyte: 37.9 %
WBC mixed population: 536 cells/uL (ref 200–950)
WBC: 9.4 10*3/uL (ref 3.8–10.8)

## 2017-04-01 LAB — LIPID PANEL
Cholesterol: 141 mg/dL (ref <200)
HDL: 50 mg/dL — AB (ref >50)
LDL Cholesterol (Calc): 71 mg/dL (calc)
NON-HDL CHOLESTEROL (CALC): 91 mg/dL (ref <130)
Total CHOL/HDL Ratio: 2.8 (calc) (ref <5.0)
Triglycerides: 115 mg/dL (ref <150)

## 2017-04-01 LAB — COMPLETE METABOLIC PANEL WITH GFR
AG RATIO: 1.6 (calc) (ref 1.0–2.5)
ALT: 29 U/L (ref 6–29)
AST: 20 U/L (ref 10–35)
Albumin: 4.3 g/dL (ref 3.6–5.1)
Alkaline phosphatase (APISO): 74 U/L (ref 33–130)
BILIRUBIN TOTAL: 0.7 mg/dL (ref 0.2–1.2)
BUN: 7 mg/dL (ref 7–25)
CALCIUM: 9.8 mg/dL (ref 8.6–10.4)
CHLORIDE: 99 mmol/L (ref 98–110)
CO2: 28 mmol/L (ref 20–32)
Creat: 0.73 mg/dL (ref 0.50–1.05)
GFR, EST AFRICAN AMERICAN: 107 mL/min/{1.73_m2} (ref >=60)
GFR, Est Non African American: 93 mL/min/{1.73_m2} (ref >=60)
GLOBULIN: 2.7 g/dL (ref 1.9–3.7)
Glucose, Bld: 285 mg/dL — ABNORMAL HIGH (ref 65–99)
POTASSIUM: 4.5 mmol/L (ref 3.5–5.3)
SODIUM: 134 mmol/L — AB (ref 135–146)
Total Protein: 7 g/dL (ref 6.1–8.1)

## 2017-04-01 LAB — HEMOGLOBIN A1C
HEMOGLOBIN A1C: 12.1 %{Hb} — AB (ref <5.7)
MEAN PLASMA GLUCOSE: 301 (calc)
eAG (mmol/L): 16.6 (calc)

## 2017-04-01 LAB — MICROALBUMIN / CREATININE URINE RATIO
CREATININE, URINE: 99 mg/dL (ref 20–275)
MICROALB UR: 1.1 mg/dL
Microalb Creat Ratio: 11 mcg/mg creat (ref <30)

## 2017-04-01 LAB — URINALYSIS, ROUTINE W REFLEX MICROSCOPIC
Bilirubin Urine: NEGATIVE
Hgb urine dipstick: NEGATIVE
Ketones, ur: NEGATIVE
LEUKOCYTES UA: NEGATIVE
NITRITE: NEGATIVE
Protein, ur: NEGATIVE
Specific Gravity, Urine: 1.028 (ref 1.001–1.03)
pH: 6 (ref 5.0–8.0)

## 2017-04-01 LAB — TSH: TSH: 0.85 mIU/L

## 2017-04-03 ENCOUNTER — Other Ambulatory Visit: Payer: Self-pay | Admitting: *Deleted

## 2017-04-03 DIAGNOSIS — G8929 Other chronic pain: Secondary | ICD-10-CM

## 2017-04-03 DIAGNOSIS — M25462 Effusion, left knee: Secondary | ICD-10-CM

## 2017-04-03 DIAGNOSIS — M25561 Pain in right knee: Principal | ICD-10-CM

## 2017-04-03 MED ORDER — DICLOFENAC SODIUM 1 % TD GEL
4.0000 g | Freq: Four times a day (QID) | TRANSDERMAL | 6 refills | Status: DC
Start: 2017-04-03 — End: 2017-05-12

## 2017-04-03 NOTE — Telephone Encounter (Signed)
Patient requested Rx to be sent to [facility].  

## 2017-04-04 ENCOUNTER — Telehealth: Payer: Self-pay

## 2017-04-04 NOTE — Telephone Encounter (Signed)
-----   Message from [place] sent at 04/03/2017  9:56 PM EST ----- Diabetes is severely uncontrolled - Increase levemir to 16 units daily; avoid complex carbs; bad/LDL cholesterol at goal; urine concentrated with large amount of sugar in it - push fluids; no diabetic kidney disease; other labs stable; follow up as scheduled

## 2017-04-04 NOTE — Telephone Encounter (Signed)
Patient's medication list has been updated based on recommendations of provider due to lab results.

## 2017-04-11 ENCOUNTER — Ambulatory Visit: Payer: Self-pay | Admitting: Internal Medicine

## 2017-04-14 ENCOUNTER — Telehealth: Payer: Self-pay | Admitting: *Deleted

## 2017-04-14 NOTE — Telephone Encounter (Signed)
Received Prior Authorization Request for Diclofenac. Initiated through [employer] and Request was DENIED. Case [id number] UJW:[id number]

## 2017-05-04 ENCOUNTER — Other Ambulatory Visit: Payer: Self-pay | Admitting: *Deleted

## 2017-05-04 MED ORDER — HYDROCHLOROTHIAZIDE 25 MG PO TABS: 25.0000 mg | ORAL_TABLET | Freq: Every day | ORAL | 0 refills | Status: DC

## 2017-05-04 NOTE — Telephone Encounter (Signed)
Patient requested. Faxed Rx to pharmacy.  

## 2017-05-12 ENCOUNTER — Ambulatory Visit
Admission: RE | Admit: 2017-05-12 | Discharge: 2017-05-12 | Disposition: A | Discharge status: Home / Self Care | Payer: 59 | Source: Ambulatory Visit | Attending: Internal Medicine | Admitting: Internal Medicine

## 2017-05-12 ENCOUNTER — Ambulatory Visit (INDEPENDENT_AMBULATORY_CARE_PROVIDER_SITE_OTHER): Payer: 59 | Admitting: Internal Medicine

## 2017-05-12 ENCOUNTER — Telehealth: Payer: Self-pay

## 2017-05-12 ENCOUNTER — Encounter: Payer: Self-pay | Admitting: Internal Medicine

## 2017-05-12 VITALS — BP 118/70 | HR 77 | Temp 98.5°F | Ht 63.0 in | Wt 246.0 lb

## 2017-05-12 DIAGNOSIS — R053 Chronic cough: Secondary | ICD-10-CM

## 2017-05-12 DIAGNOSIS — E119 Type 2 diabetes mellitus without complications: Secondary | ICD-10-CM

## 2017-05-12 DIAGNOSIS — Z794 Long term (current) use of insulin: Secondary | ICD-10-CM

## 2017-05-12 DIAGNOSIS — H6121 Impacted cerumen, right ear: Secondary | ICD-10-CM | POA: Diagnosis not present

## 2017-05-12 DIAGNOSIS — Z716 Tobacco abuse counseling: Secondary | ICD-10-CM

## 2017-05-12 DIAGNOSIS — R05 Cough: Secondary | ICD-10-CM | POA: Diagnosis not present

## 2017-05-12 DIAGNOSIS — Z0000 Encounter for general adult medical examination without abnormal findings: Secondary | ICD-10-CM | POA: Diagnosis not present

## 2017-05-12 DIAGNOSIS — L409 Psoriasis, unspecified: Secondary | ICD-10-CM

## 2017-05-12 DIAGNOSIS — G8929 Other chronic pain: Secondary | ICD-10-CM

## 2017-05-12 DIAGNOSIS — M25561 Pain in right knee: Secondary | ICD-10-CM

## 2017-05-12 DIAGNOSIS — Z1231 Encounter for screening mammogram for malignant neoplasm of breast: Secondary | ICD-10-CM | POA: Diagnosis not present

## 2017-05-12 DIAGNOSIS — Z720 Tobacco use: Secondary | ICD-10-CM

## 2017-05-12 DIAGNOSIS — M25462 Effusion, left knee: Secondary | ICD-10-CM | POA: Diagnosis not present

## 2017-05-12 DIAGNOSIS — Z1239 Encounter for other screening for malignant neoplasm of breast: Secondary | ICD-10-CM

## 2017-05-12 DIAGNOSIS — I10 Essential (primary) hypertension: Secondary | ICD-10-CM

## 2017-05-12 DIAGNOSIS — E1159 Type 2 diabetes mellitus with other circulatory complications: Secondary | ICD-10-CM | POA: Diagnosis not present

## 2017-05-12 MED ORDER — INSULIN DETEMIR 100 UNIT/ML ~~LOC~~ SOLN: 16.0000 [IU] | Freq: Every day | SUBCUTANEOUS | 11 refills | Status: DC

## 2017-05-12 MED ORDER — MELOXICAM 7.5 MG PO TABS
7.5000 mg | ORAL_TABLET | Freq: Two times a day (BID) | ORAL | 6 refills | Status: DC
Start: 2017-05-12 — End: 2017-07-12

## 2017-05-12 MED ORDER — LEVOFLOXACIN 750 MG PO TABS: 750.0000 mg | ORAL_TABLET | Freq: Every day | ORAL | 0 refills | Status: DC

## 2017-05-12 MED ORDER — ALBUTEROL SULFATE HFA 108 (90 BASE) MCG/ACT IN AERS: 2.0000 | INHALATION_SPRAY | Freq: Four times a day (QID) | RESPIRATORY_TRACT | 0 refills | Status: DC | PRN

## 2017-05-12 MED ORDER — NYSTATIN-TRIAMCINOLONE 100000-0.1 UNIT/GM-% EX CREA: 1.0000 "application " | TOPICAL_CREAM | Freq: Two times a day (BID) | CUTANEOUS | 0 refills | Status: DC

## 2017-05-12 NOTE — Telephone Encounter (Signed)
-----   Message from [place], [place] sent at 05/12/2017  4:35 PM EST ----- Chest xray shows right middle lobe pneumonia - rx LEVAQUIN 750MG  #7 TAKE 1 TAB PO DAILY WITH FOOD; start probiotic (floraster, culturelle or activia) daily while on antibiotic; use albuterol inhaler as needed for cough/wheezing; take OTC plain mucinex BID x 2 weeks; repeat chest xray in 6 weeks or sooner if not feeling better to f/u pneumonia

## 2017-05-12 NOTE — Patient Instructions (Addendum)
Increase blood sugar checks to 2 times daily as blood sugars uncontrolled  STOP VOLTAREN GEL  START MELOXICAM 7.5MG  TAKE 1 TABLET 2 TIMES DAILY WITH FOOD FOR JOINT PAIN AND SWELLING  START NYSTATIN-TRIAMCINOLONE CREAM TO RASH UNDER BREASTS 2 TIMES DAILY   Continue other medications as ordered  Will call with referral to eye specialist  Will call with imaging appt for mammogram  Will call with chest xray results  Smoking cessation discussed and highly urged  Follow up in 2 mos for DM, HTN, tobacco abuse. Fasting labs prior to appt  Keeping You Healthy  Get These Tests  Blood Pressure- Have your blood pressure checked by your healthcare provider at least once a year.  Normal blood pressure is 120/80.  Weight- Have your body mass index (BMI) calculated to screen for obesity.  BMI is a measure of body fat based on height and weight.  You can calculate your own BMI at https://www.west-esparza.com/  Cholesterol- Have your cholesterol checked every year.  Diabetes- Have your blood sugar checked every year if you have high blood pressure, high cholesterol, a family history of diabetes or if you are overweight.  Pap Test - Have a pap test every 1 to 5 years if you have been sexually active.  If you are older than 65 and recent pap tests have been normal you may not need additional pap tests.  In addition, if you have had a hysterectomy  for benign disease additional pap tests are not necessary.  Mammogram-Yearly mammograms are essential for early detection of breast cancer  Screening for Colon Cancer- Colonoscopy starting at age 73. Screening may begin sooner depending on your family history and other health conditions.  Follow up colonoscopy as directed by your Gastroenterologist.  Screening for Osteoporosis- Screening begins at age 32 with bone density scanning, sooner if you are at higher risk for developing Osteoporosis.  Get these medicines  Calcium with Vitamin D- Your body  requires 1200-1500 mg of Calcium a day and [phone] IU of Vitamin D a day.  You can only absorb 500 mg of Calcium at a time therefore Calcium must be taken in 2 or 3 separate doses throughout the day.  Hormones- Hormone therapy has been associated with increased risk for certain cancers and heart disease.  Talk to your healthcare provider about if you need relief from menopausal symptoms.  Aspirin- Ask your healthcare provider about taking Aspirin to prevent Heart Disease and Stroke.  Get these Immuniztions  Flu shot- Every fall  Pneumonia shot- Once after the age of 71; if you are younger ask your healthcare provider if you need a pneumonia shot.  Tetanus- Every ten years.  Zostavax- Once after the age of 13 to prevent shingles.  Take these steps  Don't smoke- Your healthcare provider can help you quit. For tips on how to quit, ask your healthcare provider or go to www.smokefree.gov or call 1-800 QUIT-NOW.  Be physically active- Exercise 5 days a week for a minimum of 30 minutes.  If you are not already physically active, start slow and gradually work up to 30 minutes of moderate physical activity.  Try walking, dancing, bike riding, swimming, etc.  Eat a healthy diet- Eat a variety of healthy foods such as fruits, vegetables, whole grains, low fat milk, low fat cheeses, yogurt, lean meats, chicken, fish, eggs, dried beans, tofu, etc.  For more information go to www.thenutritionsource.org  Dental visit- Brush and floss teeth twice daily; visit your dentist twice a year.  Eye exam- Visit your Optometrist or Ophthalmologist yearly.  Drink alcohol in moderation- Limit alcohol intake to one drink or less a day.  Never drink and drive.  Depression- Your emotional health is as important as your physical health.  If you're feeling down or losing interest in things you normally enjoy, please talk to your healthcare provider.  Seat Belts- can save your life; always wear one  Smoke/Carbon  Monoxide detectors- These detectors need to be installed on the appropriate level of your home.  Replace batteries at least once a year.  Violence- If anyone is threatening or hurting you, please tell your healthcare provider.  Living Will/ Health care power of attorney- Discuss with your healthcare provider and family.

## 2017-05-12 NOTE — Progress Notes (Addendum)
Patient ID: Judy ClarkAudrey L Chastang, female   DOB: 04/25/1960, 57 y.o.   MRN: [id number]   Location:  [facility]  Place of Service:  OFFICE  Provider: Elmon KirschnerMONICA S Taylorann Tkach, DO  Patient Care Team: Kirt Boysarter, Maritta Kief, DO as PCP - General (Internal Medicine)  Extended Emergency Contact Information Primary Emergency Contact: [facility],Sylvester Address: [address]          [place], [place] [address] [place] Home Phone: [phone] Mobile Phone: [phone] Relation: Brother  Code Status:  Goals of Care: Advanced Directive information Advanced Directives 08/06/2013  Does Patient Have a Medical Advance Directive? Patient does not have advance directive;Patient would not like information  Pre-existing out of facility DNR order (yellow form or pink MOST form) No     Chief Complaint  Patient presents with  . Annual Exam    Yearly check-up, DM foot exam due, and EKG   . Medication Management    Voltaren not covered by insurance   . Medication Refill    Levemir   . Health Maintenance    Eye doctor referral pending and order placed for mammogram     HPI: Patient is a 57 y.o. female seen in today for comprehensive exam.  Allergic rhinitis - uses flonase prn (at least 2 times per week) and benadryl. She still has not tried Rx antihistamine  HTN - stable on HCTZ. Takes ASA daily  DM - dx in 2015. Metformin caused severe nausea. A1c 12.1%. BS 147 this AM (prev in 200s). She checks them once per day. She takes levemir 16 units daily. She has never seen a endocrinologist but has seen diabetic educator; urine microalbumin/Cr ratio 11; LDL 71; HDL 50  Tobacco abuse - she smokes 1 ppd x 35 yrs. She has plans to quit once she loses more weight.  Hx psoriasis - uncontrolled with topical cocoa butter  [facility] 05/12/2017 03/31/2017 08/06/2013  Decreased Interest 0 0 0  Down, Depressed, Hopeless 0 0 0  PHQ - 2 Score 0 0 0    Fall Risk  05/12/2017 03/31/2017 08/06/2013  Falls in the  past year? No No No   No flowsheet data found.   Health Maintenance  Topic Date Due  . Hepatitis C Screening  1960/08/19  . FOOT EXAM  02/13/1972  . OPHTHALMOLOGY EXAM  02/13/1972  . HIV Screening  02/12/1977  . PAP SMEAR  01/19/2013  . MAMMOGRAM  02/11/2017  . HEMOGLOBIN A1C  09/28/2017  . URINE MICROALBUMIN  03/31/2018  . PNEUMOCOCCAL POLYSACCHARIDE VACCINE (2) 03/07/2019  . COLONOSCOPY  06/27/2022  . TETANUS/TDAP  04/06/2025  . INFLUENZA VACCINE  Completed    Urinary incontinence? She has occasional urinary leakage with standing from seated position and also sneezing/coughing, laughing.  Exercise? She walks approx 3 miles per day  Diet? She eats more fish (including salmon), vegetables and water  No exam data present  Hearing: no issues    Dentition: she has routine checkup at least yearly  Pain: knees and right - insurance would not cover voltaren gel  Past Medical History:  Diagnosis Date  . Abnormal MRI of abdomen 08/13/2012   [facility]   . Benign breast cyst in female    [facility] yearly [facility]   . Bilateral breast cysts    benign, needs yearly mammograms, [facility]   . Diabetes (HCC)   . Diabetes mellitus without complication (HCC)   . Fatty  liver    Noted in records received from [facility]   . GERD (gastroesophageal reflux disease)   . Heart murmur    mild  . Hepatic cyst    bening on MRI 2014, Per records from [facility]   . History of blood transfusion 1987  . Hypertension   . Obesity   . Pneumonia   . Psoriasis    Noted [facility]     Past Surgical History:  Procedure Laterality Date  . ABDOMINAL HYSTERECTOMY  2011   ovaries removed  . COLONOSCOPY WITH PROPOFOL N/A 06/26/2012   Procedure: COLONOSCOPY WITH PROPOFOL;  Surgeon: Charolett Bumpers, MD;  Location: [facility];  Service: Endoscopy;  Laterality: N/A;  . surgery for  ectopic pregnancy  1987  . TONSILLECTOMY  as child    Family History  Problem Relation Age of Onset  . Diabetes Other   . Hypertension Other   . Diabetes Mother   . Hypertension Mother   . Heart disease Mother   . Cancer Father   . Diabetes Brother   . Diabetes Brother    Family Status  Relation Name Status  . Other  (Not Specified)  . Mother [employer]  . Father Jacqlyn Krauss Deceased  . Brother [employer]  . Brother [employer]  . Brother Arlys John Alive   Social History   Socioeconomic History  . Marital status: Divorced    Spouse name: Not on file  . Number of children: Not on file  . Years of education: Not on file  . Highest education level: Not on file  Social Needs  . Financial resource strain: Not on file  . Food insecurity - worry: Not on file  . Food insecurity - inability: Not on file  . Transportation needs - medical: Not on file  . Transportation needs - non-medical: Not on file  Occupational History  . Not on file  Tobacco Use  . Smoking status: Current Every Day Smoker    Packs/day: 1.00    Years: 35.00    Pack years: 35.00    Types: Cigarettes  . Smokeless tobacco: Never Used  Substance and Sexual Activity  . Alcohol use: No  . Drug use: No  . Sexual activity: Not Currently  Other Topics Concern  . Not on file  Social History Narrative   Social History      Diet?       Do you drink/eat things with caffeine? yes      Marital status?            divorced                        What year were you married? 1985      Do you live in a house, apartment, assisted living, condo, trailer, etc.? apartment      Is it one or more stories? 1      How many persons live in your home? 1      Do you have any pets in your home? (please list) yes      Highest level of education completed? masters      Current or past profession:      Do you exercise?          yes                            Type &  how often? Walk daily      Advanced Directives        Do you have a living will? no      Do you have a DNR form?       no                           If not, do you want to discuss one?      Do you have signed POA/HPOA for forms? no      Functional Status      Do you have difficulty bathing or dressing yourself?      Do you have difficulty preparing food or eating?       Do you have difficulty managing your medications?      Do you have difficulty managing your finances?      Do you have difficulty affording your medications?    Allergies  Allergen Reactions  . Metformin And Related Nausea Only    Allergies as of 05/12/2017      Reactions   Metformin And Related Nausea Only      Medication List        Accurate as of 05/12/17 10:20 AM. Always use your most recent med list.          albuterol 108 (90 Base) MCG/ACT inhaler Commonly known as:  PROVENTIL HFA;VENTOLIN HFA Inhale into the lungs every 6 (six) hours as needed for wheezing or shortness of breath.   aspirin 81 MG tablet Take 81 mg by mouth daily.   diclofenac sodium 1 % Gel Commonly known as:  VOLTAREN Apply 4 g topically 4 (four) times daily. For arthritis   diphenhydramine-acetaminophen 25-500 MG Tabs tablet Commonly known as:  TYLENOL PM Take 1 tablet by mouth at bedtime as needed.   fluticasone 50 MCG/ACT nasal spray Commonly known as:  FLONASE Place 1 spray into both nostrils daily.   hydrochlorothiazide 25 MG tablet Commonly known as:  HYDRODIURIL Take 1 tablet (25 mg total) by mouth daily.   insulin detemir 100 UNIT/ML injection Commonly known as:  LEVEMIR Inject 16 Units into the skin at bedtime.   NOVOFINE 32G X 6 MM Misc Generic drug:  Insulin Pen Needle by Does not apply route.        Review of Systems:  Review of Systems  Eyes: Positive for visual disturbance.  Respiratory: Positive for cough.   Musculoskeletal: Positive for arthralgias and joint swelling.  All other systems reviewed and are negative.   Physical  Exam: Vitals:   05/12/17 0949  BP: 118/70  Pulse: 77  Temp: 98.5 F (36.9 C)  TempSrc: Oral  SpO2: 92%  Weight: 246 lb (111.6 kg)  Height: 5\' 3"  (1.6 m)   Body mass index is 43.58 kg/m. Physical Exam  Constitutional: She is oriented to person, place, and time. She appears well-developed and well-nourished. No distress.  HENT:  Head: Normocephalic and atraumatic.  Right Ear: External ear normal.  Left Ear: External ear normal.  Ears:  Mouth/Throat: Oropharynx is clear and moist. No oropharyngeal exudate.  MMM; no oral thrush  Eyes: EOM are normal. Pupils are equal, round, and reactive to light. No scleral icterus.  Neck: Normal range of motion. Neck supple. Carotid bruit is not present. No tracheal deviation present. No thyromegaly present.  Cardiovascular: Normal rate, regular rhythm and intact distal pulses. Exam reveals no gallop and no friction rub.  Murmur (1/6 SEM) heard. No LE edema  b/l. No calf TTP  Pulmonary/Chest: Effort normal. No respiratory distress. She has decreased breath sounds (b/l at base). She has no wheezes. She has no rales. She exhibits no tenderness.  No rhonchi  Abdominal: Soft. Bowel sounds are normal. She exhibits no distension and no mass. There is no hepatosplenomegaly or hepatomegaly. There is no tenderness. There is no rebound and no guarding. No hernia.  Musculoskeletal: She exhibits edema (small and large joint with nodules at DIP joint). She exhibits no deformity.  Lymphadenopathy:    She has no cervical adenopathy.  Neurological: She is alert and oriented to person, place, and time. She has normal reflexes.  Skin: Skin is warm and dry. Rash noted.     Psoriatic rash at tips of toes, fingers under nails  Psychiatric: She has a normal mood and affect. Her behavior is normal. Judgment and thought content normal.  Vitals reviewed.   Labs reviewed:  Basic Metabolic Panel: Recent Labs    03/31/17 1005  NA 134*  K 4.5  CL 99  CO2 28   GLUCOSE 285*  BUN 7  CREATININE 0.73  CALCIUM 9.8  TSH 0.85   Liver Function Tests: Recent Labs    03/31/17 1005  AST 20  ALT 29  BILITOT 0.7  PROT 7.0   No results for input(s): LIPASE, AMYLASE in the last 8760 hours. No results for input(s): AMMONIA in the last 8760 hours. CBC: Recent Labs    03/31/17 1005  WBC 9.4  NEUTROABS 4,944  HGB 15.4  HCT 46.3*  MCV 91.0  PLT 233   Lipid Panel: Recent Labs    03/31/17 1005  CHOL 141  HDL 50*  TRIG 115  CHOLHDL 2.8   Lab Results  Component Value Date   HGBA1C 12.1 (H) 03/31/2017    Procedures: No results found. ECG OBTAINED AND REVIEWED BY MYSELF: NSR @ 72 bpm, nml axis, LAE,poor R wave progression. No acute ischemic changes. No other ECG available to compare  Assessment/Plan   ICD-10-CM   1. Well adult exam Z00.00   2. Hypertension associated with diabetes (HCC) E11.59 EKG 12-Lead   I10   3. Type 2 diabetes mellitus without complication, with long-term current use of insulin (HCC) E11.9 insulin detemir (LEVEMIR) 100 UNIT/ML injection   Z79.4 Ambulatory referral to Ophthalmology  4. Breast cancer screening Z12.31 MM DIGITAL SCREENING BILATERAL  5. Chronic pain of right knee M25.561 meloxicam (MOBIC) 7.5 MG tablet   G89.29   6. Swelling of joint, knee, left M25.462 meloxicam (MOBIC) 7.5 MG tablet  7. Continuous tobacco abuse Z72.0 DG Chest 2 View  8. Psoriasis L40.9 nystatin-triamcinolone (MYCOLOG II) cream  9. Tobacco abuse counseling Z71.6   10. Chronic cough R05 DG Chest 2 View  11. Hearing loss of right ear due to cerumen impaction H61.21      Increase blood sugar checks to 2 times daily as blood sugars uncontrolled  STOP VOLTAREN GEL  START MELOXICAM 7.5MG  TAKE 1 TABLET 2 TIMES DAILY WITH FOOD FOR JOINT PAIN AND SWELLING  START NYSTATIN-TRIAMCINOLONE CREAM TO RASH UNDER BREASTS 2 TIMES DAILY   Continue other medications as ordered  Cerumen impaction removed using currette and forceps   Will  call with referral to eye specialist  Will call with imaging appt for mammogram  Will call with chest xray results  Smoking cessation discussed and highly urged  Follow up in 2 mos for DM, HTN, tobacco abuse. Fasting labs prior to appt  Keeping you healthy handout given  Chryl Holten S. Ancil Linsey  [facility] [address] [place], [place] [address] [phone] Cell (Monday-Friday 8 AM - 5 PM) [phone] After 5 PM and follow prompts

## 2017-05-12 NOTE — Telephone Encounter (Signed)
Medication list has been updated to reflect recommendations of provider based on xray results. Rx have been sent to the pharmacy.

## 2017-06-12 ENCOUNTER — Ambulatory Visit
Admission: RE | Admit: 2017-06-12 | Discharge: 2017-06-12 | Disposition: A | Discharge status: Home / Self Care | Payer: 59 | Source: Ambulatory Visit | Attending: Internal Medicine | Admitting: Internal Medicine

## 2017-06-12 DIAGNOSIS — Z1239 Encounter for other screening for malignant neoplasm of breast: Secondary | ICD-10-CM

## 2017-07-09 ENCOUNTER — Other Ambulatory Visit: Payer: Self-pay | Admitting: Internal Medicine

## 2017-07-09 DIAGNOSIS — E119 Type 2 diabetes mellitus without complications: Secondary | ICD-10-CM

## 2017-07-09 DIAGNOSIS — Z794 Long term (current) use of insulin: Principal | ICD-10-CM

## 2017-07-09 DIAGNOSIS — E1159 Type 2 diabetes mellitus with other circulatory complications: Secondary | ICD-10-CM

## 2017-07-09 DIAGNOSIS — I10 Essential (primary) hypertension: Secondary | ICD-10-CM

## 2017-07-09 DIAGNOSIS — I152 Hypertension secondary to endocrine disorders: Secondary | ICD-10-CM

## 2017-07-10 ENCOUNTER — Other Ambulatory Visit: Payer: 59

## 2017-07-10 DIAGNOSIS — Z794 Long term (current) use of insulin: Principal | ICD-10-CM

## 2017-07-10 DIAGNOSIS — E119 Type 2 diabetes mellitus without complications: Secondary | ICD-10-CM

## 2017-07-10 DIAGNOSIS — E1159 Type 2 diabetes mellitus with other circulatory complications: Secondary | ICD-10-CM

## 2017-07-10 DIAGNOSIS — I10 Essential (primary) hypertension: Secondary | ICD-10-CM

## 2017-07-11 LAB — BASIC METABOLIC PANEL WITH GFR
BUN: 13 mg/dL (ref 7–25)
CHLORIDE: 101 mmol/L (ref 98–110)
CO2: 29 mmol/L (ref 20–32)
Calcium: 9.3 mg/dL (ref 8.6–10.4)
Creat: 0.82 mg/dL (ref 0.50–1.05)
GFR, EST AFRICAN AMERICAN: 93 mL/min/{1.73_m2} (ref >=60)
GFR, EST NON AFRICAN AMERICAN: 81 mL/min/{1.73_m2} (ref >=60)
Glucose, Bld: 214 mg/dL — ABNORMAL HIGH (ref 65–99)
POTASSIUM: 4.5 mmol/L (ref 3.5–5.3)
SODIUM: 137 mmol/L (ref 135–146)

## 2017-07-11 LAB — LIPID PANEL
CHOL/HDL RATIO: 3.4 (calc) (ref <5.0)
Cholesterol: 149 mg/dL (ref <200)
HDL: 44 mg/dL — AB (ref >50)
LDL CHOLESTEROL (CALC): 86 mg/dL
NON-HDL CHOLESTEROL (CALC): 105 mg/dL (ref <130)
TRIGLYCERIDES: 91 mg/dL (ref <150)

## 2017-07-11 LAB — ALT: ALT: 10 U/L (ref 6–29)

## 2017-07-11 LAB — HEMOGLOBIN A1C
EAG (MMOL/L): 11.7 (calc)
Hgb A1c MFr Bld: 9 % of total Hgb — ABNORMAL HIGH (ref <5.7)
Mean Plasma Glucose: 212 (calc)

## 2017-07-12 ENCOUNTER — Ambulatory Visit (INDEPENDENT_AMBULATORY_CARE_PROVIDER_SITE_OTHER): Payer: 59 | Admitting: Internal Medicine

## 2017-07-12 ENCOUNTER — Encounter: Payer: Self-pay | Admitting: Internal Medicine

## 2017-07-12 VITALS — BP 114/70 | HR 86 | Temp 98.5°F | Ht 63.0 in | Wt 242.0 lb

## 2017-07-12 DIAGNOSIS — Z720 Tobacco use: Secondary | ICD-10-CM

## 2017-07-12 DIAGNOSIS — E1169 Type 2 diabetes mellitus with other specified complication: Secondary | ICD-10-CM

## 2017-07-12 DIAGNOSIS — L409 Psoriasis, unspecified: Secondary | ICD-10-CM | POA: Diagnosis not present

## 2017-07-12 DIAGNOSIS — E1159 Type 2 diabetes mellitus with other circulatory complications: Secondary | ICD-10-CM

## 2017-07-12 DIAGNOSIS — Z794 Long term (current) use of insulin: Secondary | ICD-10-CM | POA: Diagnosis not present

## 2017-07-12 DIAGNOSIS — I10 Essential (primary) hypertension: Secondary | ICD-10-CM

## 2017-07-12 DIAGNOSIS — R21 Rash and other nonspecific skin eruption: Secondary | ICD-10-CM

## 2017-07-12 MED ORDER — FLUTICASONE PROPIONATE 50 MCG/ACT NA SUSP: 1.0000 | Freq: Every day | NASAL | 3 refills | Status: AC

## 2017-07-12 MED ORDER — NYSTATIN-TRIAMCINOLONE 100000-0.1 UNIT/GM-% EX CREA: 1.0000 "application " | TOPICAL_CREAM | Freq: Two times a day (BID) | CUTANEOUS | 0 refills | Status: DC

## 2017-07-12 MED ORDER — INSULIN PEN NEEDLE 32G X 6 MM MISC: 11 refills | Status: DC

## 2017-07-12 NOTE — Patient Instructions (Signed)
INCREASE LEVEMIR 20 UNITS AT BEDTIME  Continue other medications as ordered  Reduce cigarette use  STOP EATING CHOCOLATE  continue diet and exercise program  Follow up in 3 mos for DM, HTN, tobacco, and obesity. Fasting labs prior to appt

## 2017-07-12 NOTE — Progress Notes (Signed)
Patient ID: Judy Fischer, female   DOB: Oct 20, 1960, 57 y.o.   MRN: [id number]   Location:  [facility] OFFICE  Provider: DR Arletha Grippe  Code Status:  Goals of Care:  Advanced Directives 08/06/2013  Does Patient Have a Medical Advance Directive? Patient does not have advance directive;Patient would not like information  Pre-existing out of facility DNR order (yellow Fischer or pink MOST Fischer) No     Chief Complaint  Patient presents with  . Medical Management of Chronic Issues    2 month follow-up on DM and HTN. Foot Exam Due, discuss labs (copy printed)   . Medication Management    Patient tried mobic x 2 weeks and discontinued due to dizziness and did not help with pain   . Medication Management    Discuss making nystatin cream a routine regimen   . Health Maintenance    Discuss need for Pap and HIV screening test     HPI: Patient is a 57 y.o. female seen today for medical management of chronic diseases.  She has increased exercise and is walking at least 4 times per week. Diet is healthier and consumes mostly vegetables, occasional red meat. She does like to snack on chocolate candy. Weight down 9 lbs. BMI 42.88  Lab results d/w pt  Rash under breasts - uses topical nystatin-triamcinolone cream. She requests monthly supply   Allergic rhinitis - uses flonase prn (at least 2 times per week) and benadryl. She still has not tried Rx antihistamine  HTN - stable on HCTZ. Takes ASA daily  DM - dx in 2015. Metformin caused severe nausea. A1c 9% (prev 12.1%). BS 184 last checked a few days ago. She checks them once per day. She takes levemir 16 units daily. She has never seen a endocrinologist but has seen diabetic educator; urine microalbumin/Cr ratio 11; LDL 86; HDL 44  Tobacco abuse - she smokes 1 ppd x 35 yrs. She has plans to quit once she loses more weight.  Hx psoriasis - improved with nystatin-triamcinolone cream   Past Medical History:  Diagnosis Date  . Abnormal MRI of  abdomen 08/13/2012   Tiny Liver lesions per [facility] records   . Benign breast cyst in female    Need yearly mammograms, noted from [facility]   . Bilateral breast cysts    benign, needs yearly mammograms, per [facility] Records   . Diabetes ([place])   . Diabetes mellitus without complication ([place])   . Fatty liver    Noted in [facility]   . GERD (gastroesophageal reflux disease)   . Heart murmur    mild  . Hepatic cyst    bening on MRI 2014, Per records from [place]   . History of blood transfusion 1987  . Hypertension   . Obesity   . Pneumonia   . Psoriasis    Noted in records received from [facility]     Past Surgical History:  Procedure Laterality Date  . ABDOMINAL HYSTERECTOMY  2011   ovaries removed  . COLONOSCOPY WITH PROPOFOL N/A 06/26/2012   Procedure: COLONOSCOPY WITH PROPOFOL;  Surgeon: Garlan Fair, MD;  Location: [facility];  Service: Endoscopy;  Laterality: N/A;  . surgery for ectopic pregnancy  1987  . TONSILLECTOMY  as child     reports that she has been smoking cigarettes.  She has a 35.00 pack-year smoking history. She has never used smokeless tobacco. She reports that she does not drink alcohol or  use drugs. Social History   Socioeconomic History  . Marital status: Divorced    Spouse name: Not on file  . Number of children: Not on file  . Years of education: Not on file  . Highest education level: Not on file  Occupational History  . Not on file  Social Needs  . Financial resource strain: Not on file  . Food insecurity:    Worry: Not on file    Inability: Not on file  . Transportation needs:    Medical: Not on file    Non-medical: Not on file  Tobacco Use  . Smoking status: Current Every Day Smoker    Packs/day: 1.00    Years: 35.00    Pack years: 35.00    Types: Cigarettes  . Smokeless tobacco: Never Used  Substance and Sexual Activity  . Alcohol use: No  . Drug use: No  . Sexual  activity: Not Currently  Lifestyle  . Physical activity:    Days per week: Not on file    Minutes per session: Not on file  . Stress: Not on file  Relationships  . Social connections:    Talks on phone: Not on file    Gets together: Not on file    Attends religious service: Not on file    Active member of club or organization: Not on file    Attends meetings of clubs or organizations: Not on file    Relationship status: Not on file  . Intimate partner violence:    Fear of current or ex partner: Not on file    Emotionally abused: Not on file    Physically abused: Not on file    Forced sexual activity: Not on file  Other Topics Concern  . Not on file  Social History Narrative   Social History      Diet?       Do you drink/eat things with caffeine? yes      Marital status?            divorced                        What year were you married? 1985      Do you live in a house, apartment, assisted living, condo, trailer, etc.? apartment      Is it one or more stories? 1      How many persons live in your home? 1      Do you have any pets in your home? (please list) yes      Highest level of education completed? masters      Current or past profession:      Do you exercise?          yes                            Type & how often? Walk daily      Advanced Directives      Do you have a living will? no      Do you have a DNR Fischer?       no                           If not, do you want to discuss one?      Do you have signed POA/HPOA for forms? no      Functional  Status      Do you have difficulty bathing or dressing yourself?      Do you have difficulty preparing food or eating?       Do you have difficulty managing your medications?      Do you have difficulty managing your finances?      Do you have difficulty affording your medications?    Family History  Problem Relation Age of Onset  . Diabetes Other   . Hypertension Other   . Diabetes Mother   .  Hypertension Mother   . Heart disease Mother   . Cancer Father   . Diabetes Brother   . Diabetes Brother     Allergies  Allergen Reactions  . Metformin And Related Nausea Only    Outpatient Encounter Medications as of 07/12/2017  Medication Sig  . albuterol (PROVENTIL HFA;VENTOLIN HFA) 108 (90 Base) MCG/ACT inhaler Inhale 2 puffs into the lungs every 6 (six) hours as needed for wheezing or shortness of breath.  Marland Kitchen aspirin 81 MG tablet Take 81 mg by mouth daily.  . diphenhydramine-acetaminophen (TYLENOL PM) 25-500 MG TABS tablet Take 1 tablet by mouth at bedtime as needed.  . fluticasone (FLONASE) 50 MCG/ACT nasal spray Place 1 spray into both nostrils daily.  . hydrochlorothiazide (HYDRODIURIL) 25 MG tablet Take 1 tablet (25 mg total) by mouth daily.  . insulin detemir (LEVEMIR) 100 UNIT/ML injection Inject 0.16 mLs (16 Units total) into the skin at bedtime.  . Insulin Pen Needle (NOVOFINE) 32G X 6 MM MISC Use once daily with administration of insulin DX E11.59  . nystatin-triamcinolone (MYCOLOG II) cream Apply 1 application topically 2 (two) times daily.  . [DISCONTINUED] fluticasone (FLONASE) 50 MCG/ACT nasal spray Place 1 spray into both nostrils daily.  . [DISCONTINUED] Insulin Pen Needle (NOVOFINE) 32G X 6 MM MISC by Does not apply route.  . [DISCONTINUED] nystatin-triamcinolone (MYCOLOG II) cream Apply 1 application topically 2 (two) times daily.  . [DISCONTINUED] levofloxacin (LEVAQUIN) 750 MG tablet Take 1 tablet (750 mg total) by mouth daily.  . [DISCONTINUED] meloxicam (MOBIC) 7.5 MG tablet Take 1 tablet (7.5 mg total) by mouth 2 (two) times daily. Take with food for joint swelling and pain   No facility-administered encounter medications on file as of 07/12/2017.     Review of Systems:  Review of Systems  Health Maintenance  Topic Date Due  . FOOT EXAM  02/13/1972  . OPHTHALMOLOGY EXAM  02/13/1972  . HIV Screening  02/12/1977  . PAP SMEAR  01/19/2013  . Hepatitis C  Screening  07/13/2018 (Originally May 09, 1960)  . INFLUENZA VACCINE  10/05/2017  . HEMOGLOBIN A1C  01/10/2018  . URINE MICROALBUMIN  03/31/2018  . PNEUMOCOCCAL POLYSACCHARIDE VACCINE (2) 03/07/2019  . MAMMOGRAM  06/13/2019  . COLONOSCOPY  06/27/2022  . TETANUS/TDAP  04/06/2025    Physical Exam: Vitals:   07/12/17 1409  BP: 114/70  Pulse: 86  Temp: 98.5 F (36.9 C)  TempSrc: Oral  SpO2: 93%  Weight: 242 lb (109.8 kg)  Height: '5\' 3"'$  (1.6 m)   Body mass index is 42.87 kg/m. Physical Exam  Constitutional: She is oriented to person, place, and time. She appears well-developed and well-nourished.  HENT:  Mouth/Throat: Oropharynx is clear and moist. No oropharyngeal exudate.  MMM; no oral thrush  Eyes: Pupils are equal, round, and reactive to light. No scleral icterus.  Neck: Neck supple. Carotid bruit is not present. No tracheal deviation present. No thyromegaly present.  Cardiovascular: Normal rate, regular rhythm and  intact distal pulses. Exam reveals no gallop and no friction rub.  Murmur (1/6 SEM) heard. No LE edema b/l. no calf TTP.   Pulmonary/Chest: Effort normal and breath sounds normal. No stridor. No respiratory distress. She has no wheezes. She has no rales.  Abdominal: Soft. Normal appearance and bowel sounds are normal. She exhibits no distension and no mass. There is no hepatomegaly. There is no tenderness. There is no rigidity, no rebound and no guarding. No hernia.  obese  Lymphadenopathy:    She has no cervical adenopathy.  Neurological: She is alert and oriented to person, place, and time. She has normal reflexes.  Skin: Skin is warm and dry. No rash noted.  Psychiatric: She has a normal mood and affect. Her behavior is normal. Judgment and thought content normal.    Labs reviewed: Basic Metabolic Panel: Recent Labs    03/31/17 1005 07/10/17 0822  NA 134* 137  K 4.5 4.5  CL 99 101  CO2 28 29  GLUCOSE 285* 214*  BUN 7 13  CREATININE 0.73 0.82  CALCIUM  9.8 9.3  TSH 0.85  --    Liver Function Tests: Recent Labs    03/31/17 1005 07/10/17 0822  AST 20  --   ALT 29 10  BILITOT 0.7  --   PROT 7.0  --    No results for input(s): LIPASE, AMYLASE in the last 8760 hours. No results for input(s): AMMONIA in the last 8760 hours. CBC: Recent Labs    03/31/17 1005  WBC 9.4  NEUTROABS 4,944  HGB 15.4  HCT 46.3*  MCV 91.0  PLT 233   Lipid Panel: Recent Labs    03/31/17 1005 07/10/17 0822  CHOL 141 149  HDL 50* 44*  LDLCALC 71 86  TRIG 115 91  CHOLHDL 2.8 3.4   Lab Results  Component Value Date   HGBA1C 9.0 (H) 07/10/2017    Procedures since last visit: No results found.  Assessment/Plan   ICD-10-CM   1. Type 2 diabetes mellitus with other specified complication, with long-term current use of insulin (HCC) - uncontrolled but improving E11.69 BMP with eGFR([facility])   Z79.4 ALT    Hemoglobin A1c    Lipid Panel  2. Continuous tobacco abuse Z72.0   3. Morbid obesity ([place]) E66.01   4. Hypertension associated with diabetes ([place]) E11.59    I10   5. Rash resolving R21    likely yeast  6. Psoriasis L40.9 nystatin-triamcinolone (MYCOLOG II) cream     Discussed weight loss - needs to get <170 in order for weight to drop to overweight   INCREASE LEVEMIR 20 UNITS AT BEDTIME  Continue other medications as ordered  Reduce cigarette use  STOP EATING CHOCOLATE  continue diet and exercise program  Follow up in 3 mos for DM, HTN, tobacco, and obesity. Fasting labs prior to appt    [place] S. Perlie Gold  [facility] [address] [place], [place] [address] [phone] Cell (Monday-Friday 8 AM - 5 PM) [phone] After 5 PM and follow prompts

## 2017-09-19 ENCOUNTER — Other Ambulatory Visit: Payer: Self-pay | Admitting: *Deleted

## 2017-09-19 DIAGNOSIS — E118 Type 2 diabetes mellitus with unspecified complications: Secondary | ICD-10-CM

## 2017-10-16 ENCOUNTER — Other Ambulatory Visit: Payer: 59

## 2017-10-16 DIAGNOSIS — E1169 Type 2 diabetes mellitus with other specified complication: Secondary | ICD-10-CM

## 2017-10-16 DIAGNOSIS — Z794 Long term (current) use of insulin: Principal | ICD-10-CM

## 2017-10-16 LAB — HM DIABETES EYE EXAM

## 2017-10-17 LAB — BASIC METABOLIC PANEL WITH GFR
BUN: 10 mg/dL (ref 7–25)
CO2: 30 mmol/L (ref 20–32)
CREATININE: 0.77 mg/dL (ref 0.50–1.05)
Calcium: 9.7 mg/dL (ref 8.6–10.4)
Chloride: 103 mmol/L (ref 98–110)
GFR, EST AFRICAN AMERICAN: 101 mL/min/{1.73_m2} (ref >=60)
GFR, EST NON AFRICAN AMERICAN: 87 mL/min/{1.73_m2} (ref >=60)
Glucose, Bld: 180 mg/dL — ABNORMAL HIGH (ref 65–99)
POTASSIUM: 4.7 mmol/L (ref 3.5–5.3)
SODIUM: 140 mmol/L (ref 135–146)

## 2017-10-17 LAB — LIPID PANEL
CHOL/HDL RATIO: 3.2 (calc) (ref <5.0)
CHOLESTEROL: 131 mg/dL (ref <200)
HDL: 41 mg/dL — AB (ref >50)
LDL CHOLESTEROL (CALC): 71 mg/dL
Non-HDL Cholesterol (Calc): 90 mg/dL (calc) (ref <130)
TRIGLYCERIDES: 104 mg/dL (ref <150)

## 2017-10-17 LAB — HEMOGLOBIN A1C
EAG (MMOL/L): 8.4 (calc)
HEMOGLOBIN A1C: 6.9 %{Hb} — AB (ref <5.7)
MEAN PLASMA GLUCOSE: 151 (calc)

## 2017-10-17 LAB — ALT: ALT: 9 U/L (ref 6–29)

## 2017-10-18 ENCOUNTER — Ambulatory Visit: Payer: 59 | Admitting: Internal Medicine

## 2017-10-18 ENCOUNTER — Telehealth: Payer: Self-pay

## 2017-10-18 ENCOUNTER — Ambulatory Visit (INDEPENDENT_AMBULATORY_CARE_PROVIDER_SITE_OTHER): Payer: 59 | Admitting: Internal Medicine

## 2017-10-18 ENCOUNTER — Encounter: Payer: Self-pay | Admitting: Internal Medicine

## 2017-10-18 VITALS — BP 126/80 | HR 82 | Temp 98.5°F | Ht 63.0 in | Wt 243.0 lb

## 2017-10-18 DIAGNOSIS — E1169 Type 2 diabetes mellitus with other specified complication: Secondary | ICD-10-CM

## 2017-10-18 DIAGNOSIS — E1159 Type 2 diabetes mellitus with other circulatory complications: Secondary | ICD-10-CM

## 2017-10-18 DIAGNOSIS — Z794 Long term (current) use of insulin: Secondary | ICD-10-CM

## 2017-10-18 DIAGNOSIS — Z720 Tobacco use: Secondary | ICD-10-CM | POA: Diagnosis not present

## 2017-10-18 DIAGNOSIS — Z716 Tobacco abuse counseling: Secondary | ICD-10-CM

## 2017-10-18 DIAGNOSIS — I10 Essential (primary) hypertension: Secondary | ICD-10-CM

## 2017-10-18 DIAGNOSIS — L409 Psoriasis, unspecified: Secondary | ICD-10-CM

## 2017-10-18 MED ORDER — BUPROPION HCL ER (SR) 150 MG PO TB12: ORAL_TABLET | ORAL | 3 refills | Status: DC

## 2017-10-18 MED ORDER — HYDROCHLOROTHIAZIDE 25 MG PO TABS: 25.0000 mg | ORAL_TABLET | Freq: Every day | ORAL | 3 refills | Status: DC

## 2017-10-18 MED ORDER — PREDNISONE 10 MG PO TABS: ORAL_TABLET | ORAL | 0 refills | Status: DC

## 2017-10-18 NOTE — Telephone Encounter (Signed)
Noted.   Copied from CRM [id number]. Topic: Appointment Scheduling - New Patient >> Oct 18, 2017 10:42 AM Tamela OddiMartin, Don'[place], NT wrote: New patient has been scheduled for your office. Provider: Claris Gowerharlotte Date of Appointment: 01/15/18  Route to department's PEC pool.

## 2017-10-18 NOTE — Patient Instructions (Addendum)
START BUPROPION SR 150MG   DAILY X 3 DAYS THEN INCREASE TO 2 TIMES DAILY  START PREDNISONE TAPER AS DIRECTED  Continue other medications as ordered  Follow up in 3 mos with new provider: [facility]

## 2017-10-18 NOTE — Progress Notes (Signed)
Patient ID: Judy Fischer, female   DOB: 1961/01/27, 57 y.o.   MRN: [id number]   Location:  [facility] OFFICE  Provider: DR Elmon Kirschner  Code Status:  Goals of Care:  Advanced Directives 10/18/2017  Does Patient Have a Medical Advance Directive? No  Would patient like information on creating a medical advance directive? Yes (MAU/Ambulatory/Procedural Areas - Information given)  Pre-existing out of facility DNR order (yellow form or pink MOST form) -     Chief Complaint  Patient presents with  . Medical Management of Chronic Issues    3 month follow-up on DM, HTN, tobacco and obesity. Discuss labs (copy given to patient)   . Health Maintenance    DM foot exam due, discuss need for HIV screening and Pap Smear   . Screening    Audit C screening neg   . Medication Refill    HCTZ 90 day to [facility]     HPI: Patient is a 57 y.o. female seen today for medical management of chronic diseases.    Rash under breasts - uses topical nystatin-triamcinolone cream. She requests monthly supply   Allergic rhinitis - uses flonase prn (at least 2 times per week) and benadryl. She still has not tried Rx antihistamine  HTN - stable on HCTZ. Takes ASA daily  DM - dx in 2015. Metformin caused severe nausea. A1c 9% (prev 12.1%). BS 184 last checked a few days ago. She checks them once per day. She takes levemir 20 units daily. She has never seen a endocrinologist but has seen diabetic educator; urine microalbumin/Cr ratio 11; LDL 71; HDL 41  Tobacco abuse - she smokes 1 ppd x 35 yrs. She has plans to quit once she loses more weight.  Hx psoriasis - slowly improving with nystatin-triamcinolone cream but she noticed increased finger and toenail    Past Medical History:  Diagnosis Date  . Abnormal MRI of abdomen 08/13/2012   Tiny Liver lesions per [facility] records   . Benign breast cyst in female    Need yearly mammograms, noted from [facility]   . Bilateral breast cysts    benign,  needs yearly mammograms, per [facility] Records   . Diabetes (HCC)   . Diabetes mellitus without complication (HCC)   . Fatty liver    Noted in records received from [facility]   . GERD (gastroesophageal reflux disease)   . Heart murmur    mild  . Hepatic cyst    bening on MRI 2014, Per records from [place]   . History of blood transfusion 1987  . Hypertension   . Obesity   . Pneumonia   . Psoriasis    Noted in records received [facility]     Past Surgical History:  Procedure Laterality Date  . ABDOMINAL HYSTERECTOMY  2011   ovaries removed  . COLONOSCOPY WITH PROPOFOL N/A 06/26/2012   Procedure: COLONOSCOPY WITH PROPOFOL;  Surgeon: Charolett Bumpers, MD;  Location: [facility];  Service: Endoscopy;  Laterality: N/A;  . surgery for ectopic pregnancy  1987  . TONSILLECTOMY  as child     reports that she has been smoking cigarettes. She has a 35.00 pack-year smoking history. She has never used smokeless tobacco. She reports that she does not drink alcohol or use drugs. Social History   Socioeconomic History  . Marital status: Divorced    Spouse name: Not on file  . Number of children: Not on file  . Years of education: Not on  file  . Highest education level: Not on file  Occupational History  . Not on file  Social Needs  . Financial resource strain: Not on file  . Food insecurity:    Worry: Not on file    Inability: Not on file  . Transportation needs:    Medical: Not on file    Non-medical: Not on file  Tobacco Use  . Smoking status: Current Every Day Smoker    Packs/day: 1.00    Years: 35.00    Pack years: 35.00    Types: Cigarettes  . Smokeless tobacco: Never Used  Substance and Sexual Activity  . Alcohol use: No  . Drug use: No  . Sexual activity: Not Currently  Lifestyle  . Physical activity:    Days per week: Not on file    Minutes per session: Not on file  . Stress: Not on file  Relationships  . Social connections:    Talks on phone: Not  on file    Gets together: Not on file    Attends religious service: Not on file    Active member of club or organization: Not on file    Attends meetings of clubs or organizations: Not on file    Relationship status: Not on file  . Intimate partner violence:    Fear of current or ex partner: Not on file    Emotionally abused: Not on file    Physically abused: Not on file    Forced sexual activity: Not on file  Other Topics Concern  . Not on file  Social History Narrative   Social History      Diet?       Do you drink/eat things with caffeine? yes      Marital status?            divorced                        What year were you married? 1985      Do you live in a house, apartment, assisted living, condo, trailer, etc.? apartment      Is it one or more stories? 1      How many persons live in your home? 1      Do you have any pets in your home? (please list) yes      Highest level of education completed? masters      Current or past profession:      Do you exercise?          yes                            Type & how often? Walk daily      Advanced Directives      Do you have a living will? no      Do you have a DNR form?       no                           If not, do you want to discuss one?      Do you have signed POA/HPOA for forms? no      Functional Status      Do you have difficulty bathing or dressing yourself?      Do you have difficulty preparing food or eating?       Do you have  difficulty managing your medications?      Do you have difficulty managing your finances?      Do you have difficulty affording your medications?    Family History  Problem Relation Age of Onset  . Diabetes Other   . Hypertension Other   . Diabetes Mother   . Hypertension Mother   . Heart disease Mother   . Cancer Father   . Diabetes Brother   . Diabetes Brother     Allergies  Allergen Reactions  . Metformin And Related Nausea Only    Outpatient Encounter  Medications as of 10/18/2017  Medication Sig  . albuterol (PROVENTIL HFA;VENTOLIN HFA) 108 (90 Base) MCG/ACT inhaler Inhale 2 puffs into the lungs every 6 (six) hours as needed for wheezing or shortness of breath.  Marland Kitchen. aspirin 81 MG tablet Take 81 mg by mouth daily.  . diphenhydramine-acetaminophen (TYLENOL PM) 25-500 MG TABS tablet Take 1 tablet by mouth at bedtime as needed.  . fluticasone (FLONASE) 50 MCG/ACT nasal spray Place 1 spray into both nostrils daily.  . hydrochlorothiazide (HYDRODIURIL) 25 MG tablet Take 1 tablet (25 mg total) by mouth daily.  . Insulin Detemir (LEVEMIR FLEXTOUCH) 100 UNIT/ML Pen Inject 20 Units into the skin at bedtime.  . Insulin Pen Needle (NOVOFINE) 32G X 6 MM MISC Use once daily with administration of insulin DX E11.59  . nystatin-triamcinolone (MYCOLOG II) cream Apply 1 application topically 2 (two) times daily.  . [DISCONTINUED] insulin detemir (LEVEMIR) 100 UNIT/ML injection Inject 0.16 mLs (16 Units total) into the skin at bedtime.   No facility-administered encounter medications on file as of 10/18/2017.     Review of Systems:  Review of Systems  Skin: Positive for rash.  All other systems reviewed and are negative.   Health Maintenance  Topic Date Due  . FOOT EXAM  02/13/1972  . OPHTHALMOLOGY EXAM  02/13/1972  . HIV Screening  02/12/1977  . PAP SMEAR  01/19/2013  . INFLUENZA VACCINE  10/05/2017  . Hepatitis C Screening  07/13/2018 (Originally 07/15/1960)  . URINE MICROALBUMIN  03/31/2018  . HEMOGLOBIN A1C  04/18/2018  . MAMMOGRAM  06/13/2019  . COLONOSCOPY  06/27/2022  . TETANUS/TDAP  04/06/2025  . PNEUMOCOCCAL POLYSACCHARIDE VACCINE AGE 25-64 HIGH RISK  Completed    Physical Exam: Vitals:   10/18/17 0828  BP: 126/80  Pulse: 82  Temp: 98.5 F (36.9 C)  TempSrc: Oral  SpO2: 94%  Weight: 243 lb (110.2 kg)  Height: 5\' 3"  (1.6 m)   Body mass index is 43.05 kg/m. Physical Exam  Constitutional: She is oriented to person, place, and  time. She appears well-developed and well-nourished.  HENT:  Mouth/Throat: Oropharynx is clear and moist. No oropharyngeal exudate.  MMM; no oral thrush  Eyes: Pupils are equal, round, and reactive to light. No scleral icterus.  Neck: Neck supple. Carotid bruit is not present. No tracheal deviation present. No thyromegaly present.  Cardiovascular: Normal rate, regular rhythm, normal heart sounds and intact distal pulses. Exam reveals no gallop and no friction rub.  No murmur heard. No LE edema b/l. no calf TTP.   Pulmonary/Chest: Effort normal and breath sounds normal. No stridor. No respiratory distress. She has no wheezes. She has no rales.  Abdominal: Soft. Normal appearance and bowel sounds are normal. She exhibits no distension and no mass. There is no hepatomegaly. There is no tenderness. There is no rigidity, no rebound and no guarding. No hernia.  obese  Musculoskeletal: She exhibits edema.  Lymphadenopathy:  She has no cervical adenopathy.  Neurological: She is alert and oriented to person, place, and time. She has normal reflexes.  Skin: Skin is warm and dry. Rash (psoriatic rash on nails) noted.  Psychiatric: She has a normal mood and affect. Her behavior is normal. Judgment and thought content normal.   Diabetic Foot Exam - Simple   Simple Foot Form Diabetic Foot exam was performed with the following findings:  Yes 10/18/2017  9:45 AM  Visual Inspection No deformities, no ulcerations, no other skin breakdown bilaterally:  Yes See comments:  Yes Sensation Testing Intact to touch and monofilament testing bilaterally:  Yes Pulse Check Posterior Tibialis and Dorsalis pulse intact bilaterally:  Yes Comments Toenail changes noted      Labs reviewed: Basic Metabolic Panel: Recent Labs    03/31/17 1005 07/10/17 0822 10/16/17 0815  NA 134* 137 140  K 4.5 4.5 4.7  CL 99 101 103  CO2 28 29 30   GLUCOSE 285* 214* 180*  BUN 7 13 10   CREATININE 0.73 0.82 0.77  CALCIUM  9.8 9.3 9.7  TSH 0.85  --   --    Liver Function Tests: Recent Labs    03/31/17 1005 07/10/17 0822 10/16/17 0815  AST 20  --   --   ALT 29 10 9   BILITOT 0.7  --   --   PROT 7.0  --   --    No results for input(s): LIPASE, AMYLASE in the last 8760 hours. No results for input(s): AMMONIA in the last 8760 hours. CBC: Recent Labs    03/31/17 1005  WBC 9.4  NEUTROABS 4,944  HGB 15.4  HCT 46.3*  MCV 91.0  PLT 233   Lipid Panel: Recent Labs    03/31/17 1005 07/10/17 0822 10/16/17 0815  CHOL 141 149 131  HDL 50* 44* 41*  LDLCALC 71 86 71  TRIG 115 91 104  CHOLHDL 2.8 3.4 3.2   Lab Results  Component Value Date   HGBA1C 6.9 (H) 10/16/2017    Procedures since last visit: No results found.  Assessment/Plan   ICD-10-CM   1. Type 2 diabetes mellitus with other specified complication, with long-term current use of insulin (HCC) E11.69    Z79.4   2. Morbid obesity (HCC) E66.01   3. Hypertension associated with diabetes (HCC) E11.59 hydrochlorothiazide (HYDRODIURIL) 25 MG tablet   I10   4. Continuous tobacco abuse Z72.0 buPROPion (WELLBUTRIN SR) 150 MG 12 hr tablet  5. Psoriasis L40.9 predniSONE (DELTASONE) 10 MG tablet  6. Tobacco abuse counseling - spent > 15 minutes Z71.6     Smoking cessation discussed - she prefers pharmacologic therapy; START BUPROPION SR 150MG   DAILY X 3 DAYS THEN INCREASE TO 2 TIMES DAILY  START PREDNISONE TAPER AS DIRECTED  Continue other medications as ordered  Follow up in 3 mos with new provider: Alysia Pennaharlotte Nche[facility]    Judy Fischer, D. O., F. A. C. O. I.  [facility] [address] [place], [place] [address] [phone] Cell (Monday-Friday 8 AM - 5 PM) [phone] After 5 PM and follow prompts

## 2017-10-19 ENCOUNTER — Encounter: Payer: Self-pay | Admitting: Internal Medicine

## 2017-10-25 ENCOUNTER — Encounter: Payer: Self-pay | Admitting: Internal Medicine

## 2017-11-06 LAB — HEPATIC FUNCTION PANEL
ALT: 11 (ref 7–35)
ALT: 12 (ref 7–35)
AST: 12 — AB (ref 13–35)
AST: 9 — AB (ref 13–35)
Alkaline Phosphatase: 66 (ref 25–125)
Alkaline Phosphatase: 68 (ref 25–125)
BILIRUBIN DIRECT: 0.2 (ref 0.01–0.4)
BILIRUBIN, TOTAL: 0.6
BILIRUBIN, TOTAL: 0.7

## 2017-11-06 LAB — CBC AND DIFFERENTIAL
HEMATOCRIT: 40 (ref 36–46)
HEMOGLOBIN: 13.7 (ref 12.0–16.0)
Platelets: 252 (ref 150–399)
WBC: 10.4

## 2017-11-06 LAB — LIPID PANEL
CHOLESTEROL: 135 (ref 0–200)
HDL: 49 (ref 35–70)
LDL CALC: 73
Triglycerides: 67 (ref 40–160)

## 2017-11-06 LAB — TSH: TSH: 0.27 — AB (ref 0.41–5.90)

## 2017-11-06 LAB — BASIC METABOLIC PANEL
BUN: 9 (ref 4–21)
Creatinine: 0.7 (ref 0.5–1.1)
GLUCOSE: 239
POTASSIUM: 3.9 (ref 3.4–5.3)
SODIUM: 137 (ref 137–147)

## 2017-11-06 LAB — HEMOGLOBIN A1C: HEMOGLOBIN A1C: 8.2 — AB (ref 4.0–6.0)

## 2017-11-07 LAB — HEPATIC FUNCTION PANEL
ALT: 11 (ref 7–35)
AST: 8 — AB (ref 13–35)
Alkaline Phosphatase: 64 (ref 25–125)
Bilirubin, Total: 0.5

## 2017-11-07 LAB — CBC AND DIFFERENTIAL
HEMATOCRIT: 39 (ref 36–46)
HEMOGLOBIN: 13.1 (ref 12.0–16.0)
PLATELETS: 246 (ref 150–399)
WBC: 12.3

## 2017-11-07 LAB — POCT INR: INR: 1 (ref 0.9–1.1)

## 2017-11-07 LAB — BASIC METABOLIC PANEL
BUN: 16 (ref 4–21)
Creatinine: 0.8 (ref 0.5–1.1)
Glucose: 569
POTASSIUM: 3.9 (ref 3.4–5.3)
Sodium: 136 — AB (ref 137–147)

## 2017-11-07 LAB — PROTIME-INR: Protime: 10.3 (ref 10.0–13.8)

## 2017-11-08 ENCOUNTER — Telehealth: Payer: Self-pay

## 2017-11-08 NOTE — Telephone Encounter (Signed)
I have made the 1st attempt to contact the patient or family member in charge, in order to follow up from recently being discharged from [facility] center. I left a message on voicemail but I will make another attempt at a different time.

## 2017-11-08 NOTE — Telephone Encounter (Signed)
Transition Care Management Follow-up Telephone Call  Date of discharge and from where: [facility] in [place]  How have you been since you were released from the hospital? Has been feeling much better. Per patient she said she was in [place] on Friday and felt fine, on [place] she was in the heat a lot and was getting SOB with any movement. It didn't get better so she went to the ER on Monday. They said it was COPD and received many nebulizers, steroids,and  insuiln. She was released this afternoon.  Any questions or concerns? No   Items Reviewed:  Did the pt receive and understand the discharge instructions provided? Yes   Medications obtained and verified? Yes  Nicotine patch and nebulizers  Any new allergies since your discharge? No   Dietary orders reviewed? Yes  Do you have support at home? No   Other (ie: DME, Home Health, etc) N/A  Functional Questionnaire: (I = Independent and D = Dependent) ADL's: I  Bathing/Dressing- I   Meal Prep- I  Eating- I  Maintaining continence- I  Transferring/Ambulation- I  Managing Meds- I   Follow up appointments reviewed:    PCP Hospital f/u appt confirmed? Yes  Scheduled to see Dr. Montez Morita on 11/21/17 @ 8:53m.  Specialist Hospital f/u appt confirmed? N/A  Are transportation arrangements needed? No   If their condition worsens, is the pt aware to call  their PCP or go to the ED? Yes  Was the patient provided with contact information for the PCP's office or ED? Yes  Was the pt encouraged to call back with questions or concerns? Yes

## 2017-11-21 ENCOUNTER — Encounter: Payer: Self-pay | Admitting: Internal Medicine

## 2017-11-21 ENCOUNTER — Ambulatory Visit (INDEPENDENT_AMBULATORY_CARE_PROVIDER_SITE_OTHER): Payer: 59 | Admitting: Internal Medicine

## 2017-11-21 VITALS — BP 124/76 | HR 98 | Temp 98.2°F | Ht 63.0 in | Wt 234.2 lb

## 2017-11-21 DIAGNOSIS — Z720 Tobacco use: Secondary | ICD-10-CM

## 2017-11-21 DIAGNOSIS — E1159 Type 2 diabetes mellitus with other circulatory complications: Secondary | ICD-10-CM

## 2017-11-21 DIAGNOSIS — J449 Chronic obstructive pulmonary disease, unspecified: Secondary | ICD-10-CM

## 2017-11-21 DIAGNOSIS — Z794 Long term (current) use of insulin: Secondary | ICD-10-CM

## 2017-11-21 DIAGNOSIS — I10 Essential (primary) hypertension: Secondary | ICD-10-CM

## 2017-11-21 DIAGNOSIS — E1169 Type 2 diabetes mellitus with other specified complication: Secondary | ICD-10-CM

## 2017-11-21 MED ORDER — SYMBICORT 160-4.5 MCG/ACT IN AERO: 2.0000 | INHALATION_SPRAY | Freq: Two times a day (BID) | RESPIRATORY_TRACT | 6 refills | Status: DC

## 2017-11-21 MED ORDER — NICOTINE 7 MG/24HR TD PT24: 7.0000 mg | MEDICATED_PATCH | Freq: Every day | TRANSDERMAL | 0 refills | Status: DC

## 2017-11-21 MED ORDER — NICOTINE 14 MG/24HR TD PT24: 14.0000 mg | MEDICATED_PATCH | Freq: Every day | TRANSDERMAL | 0 refills | Status: DC

## 2017-11-21 NOTE — Progress Notes (Signed)
Patient ID: Judy Fischer, female   DOB: [birth year], 57 y.o.   MRN: [id number]   Location:  [facility] Place of Service:  OFFICE Provider: DR Elmon Kirschner  Code Status:  Goals of Care:  Advanced Directives 11/21/2017  Does Patient Have a Medical Advance Directive? No  Would patient like information on creating a medical advance directive? -  Pre-existing out of facility DNR order (yellow form or pink MOST form) -     Chief Complaint  Patient presents with  . Transitions Of Care    pt is being seen due to recent hospitalization in [place] for COPD.     HPI: Patient is a 57 y.o. female seen today for hospital follow-up s/p admission from  [facility] in [place], [place]  for COPD exacerbation. She is a chronic smoker and had not started wellbutrin SR prior to hospital admission. Since d/c, she has resumed smoking but is down to 4 cigs/day. She wears nicotine patch most of the day but removes the patch in evening to smoke. No CP or SOB but feels fatigue. She has lost 9 lbs. She completed daliresp, abx and prednisone taper. Hospital d/c not available at time of appt  Past Medical History:  Diagnosis Date  . Abnormal MRI of abdomen 08/13/2012   Tiny Liver lesions per [facility] records   . Benign breast cyst in female    Need yearly mammograms, noted from [facility]   . Bilateral breast cysts    benign, needs yearly mammograms, per [facility] Records   . Diabetes (HCC)   . Diabetes mellitus without complication (HCC)   . Fatty liver    Noted in records received from [facility]   . GERD (gastroesophageal reflux disease)   . Heart murmur    mild  . Hepatic cyst    bening on MRI 2014, Per records from [facility]   . History of blood transfusion 1987  . Hypertension   . Obesity   . Pneumonia   . Psoriasis    Noted in records received from [facility]     Past Surgical History:  Procedure Laterality Date  . ABDOMINAL HYSTERECTOMY  2011    ovaries removed  . COLONOSCOPY WITH PROPOFOL N/A 06/26/2012   Procedure: COLONOSCOPY WITH PROPOFOL;  Surgeon: Charolett Bumpers, MD;  Location: [facility] ENDOSCOPY;  Service: Endoscopy;  Laterality: N/A;  . surgery for ectopic pregnancy  1987  . TONSILLECTOMY  as child    Allergies  Allergen Reactions  . Metformin And Related Nausea Only    Outpatient Encounter Medications as of 11/21/2017  Medication Sig  . albuterol (PROVENTIL HFA;VENTOLIN HFA) 108 (90 Base) MCG/ACT inhaler Inhale 2 puffs into the lungs every 6 (six) hours as needed for wheezing or shortness of breath.  Marland Kitchen aspirin 81 MG tablet Take 81 mg by mouth daily.  . diphenhydramine-acetaminophen (TYLENOL PM) 25-500 MG TABS tablet Take 1 tablet by mouth at bedtime as needed.  . fluticasone (FLONASE) 50 MCG/ACT nasal spray Place 1 spray into both nostrils daily.  . hydrochlorothiazide (HYDRODIURIL) 25 MG tablet Take 1 tablet (25 mg total) by mouth daily.  . Insulin Detemir (LEVEMIR FLEXTOUCH) 100 UNIT/ML Pen Inject 20 Units into the skin at bedtime.  . Insulin Pen Needle (NOVOFINE) 32G X 6 MM MISC Use once daily with administration of insulin DX E11.59  . ipratropium-albuterol (DUONEB) 0.5-2.5 (3) MG/3ML SOLN Take 3 mLs by nebulization every 6 (six) hours as needed.  . nicotine (NICODERM  CQ - DOSED IN MG/24 HOURS) 21 mg/24hr patch Place 21 mg onto the skin daily.  Marland Kitchen. nystatin-triamcinolone (MYCOLOG II) cream Apply 1 application topically 2 (two) times daily.  . SYMBICORT 160-4.5 MCG/ACT inhaler Inhale 2 puffs into the lungs 2 (two) times daily.  Marland Kitchen. buPROPion (WELLBUTRIN SR) 150 MG 12 hr tablet Take 1 tab po daily x 3 days then increase to every 12 hrs for smoking cessation (Patient not taking: Reported on 11/21/2017)  . [DISCONTINUED] predniSONE (DELTASONE) 10 MG tablet Take 4 tabs po daily x 2 days then 3 tabs daily x 2 days then 2 tabs daily x 2 days then 1 tab daily x 2 days and stop for psoriasis   No facility-administered encounter  medications on file as of 11/21/2017.     Review of Systems:  Review of Systems  Constitutional: Positive for fatigue.  All other systems reviewed and are negative.   Health Maintenance  Topic Date Due  . OPHTHALMOLOGY EXAM  02/13/1972  . HIV Screening  02/12/1977  . PAP SMEAR  01/19/2013  . INFLUENZA VACCINE  10/05/2017  . Hepatitis C Screening  07/13/2018 (Originally 07/12/1960)  . URINE MICROALBUMIN  03/31/2018  . HEMOGLOBIN A1C  04/18/2018  . FOOT EXAM  10/19/2018  . MAMMOGRAM  06/13/2019  . COLONOSCOPY  06/27/2022  . TETANUS/TDAP  04/06/2025  . PNEUMOCOCCAL POLYSACCHARIDE VACCINE AGE 56-64 HIGH RISK  Completed    Physical Exam: Vitals:   11/21/17 0836  BP: 124/76  Pulse: 98  Temp: 98.2 F (36.8 C)  TempSrc: Oral  SpO2: 95%  Weight: 234 lb 3.2 oz (106.2 kg)  Height: 5\' 3"  (1.6 m)   Body mass index is 41.49 kg/m. Physical Exam  Constitutional: She is oriented to person, place, and time. She appears well-developed and well-nourished.  HENT:  Mouth/Throat: Oropharynx is clear and moist. No oropharyngeal exudate.  MMM; no oral thrush  Eyes: Pupils are equal, round, and reactive to light. No scleral icterus.  Neck: Neck supple. Carotid bruit is not present. No tracheal deviation present. No thyromegaly present.  Cardiovascular: Normal rate, regular rhythm and intact distal pulses. Exam reveals no gallop and no friction rub.  Murmur (1/6 SEM) heard. No LE edema b/l. no calf TTP.   Pulmonary/Chest: Effort normal. No stridor. No respiratory distress. She has decreased breath sounds (B/L). She has no wheezes. She has no rales.  Abdominal: Soft. Normal appearance and bowel sounds are normal. She exhibits no distension and no mass. There is no hepatomegaly. There is no tenderness. There is no rigidity, no rebound and no guarding. No hernia.  OBESE  Lymphadenopathy:    She has no cervical adenopathy.  Neurological: She is alert and oriented to person, place, and time. She  has normal reflexes.  Skin: Skin is warm and dry. No rash noted.  Psychiatric: She has a normal mood and affect. Her behavior is normal. Judgment and thought content normal.    Labs reviewed: Basic Metabolic Panel: Recent Labs    03/31/17 1005 07/10/17 0822 10/16/17 0815  NA 134* 137 140  K 4.5 4.5 4.7  CL 99 101 103  CO2 28 29 30   GLUCOSE 285* 214* 180*  BUN 7 13 10   CREATININE 0.73 0.82 0.77  CALCIUM 9.8 9.3 9.7  TSH 0.85  --   --    Liver Function Tests: Recent Labs    03/31/17 1005 07/10/17 0822 10/16/17 0815  AST 20  --   --   ALT 29 10 9  BILITOT 0.7  --   --   PROT 7.0  --   --    No results for input(s): LIPASE, AMYLASE in the last 8760 hours. No results for input(s): AMMONIA in the last 8760 hours. CBC: Recent Labs    03/31/17 1005  WBC 9.4  NEUTROABS 4,944  HGB 15.4  HCT 46.3*  MCV 91.0  PLT 233   Lipid Panel: Recent Labs    03/31/17 1005 07/10/17 0822 10/16/17 0815  CHOL 141 149 131  HDL 50* 44* 41*  LDLCALC 71 86 71  TRIG 115 91 104  CHOLHDL 2.8 3.4 3.2   Lab Results  Component Value Date   HGBA1C 6.9 (H) 10/16/2017    Procedures since last visit: Mm 3d Screen Breast Bilateral  Result Date: 06/12/2017 CLINICAL DATA:  Screening. EXAM: DIGITAL SCREENING BILATERAL MAMMOGRAM WITH TOMO AND CAD COMPARISON:  Previous exam(s). ACR Breast Density Category c: The breast tissue is heterogeneously dense, which may obscure small masses. FINDINGS: There are no findings suspicious for malignancy. Images were processed with CAD. IMPRESSION: No mammographic evidence of malignancy. A result letter of this screening mammogram will be mailed directly to the patient. RECOMMENDATION: Screening mammogram in one year. (Code:SM-B-01Y) BI-RADS CATEGORY  1: Negative. Electronically Signed   By: Britta Mccreedy M.D.   On: 06/12/2017 13:49    Assessment/Plan   ICD-10-CM   1. Continuous tobacco abuse Z72.0 nicotine (NICODERM CQ - DOSED IN MG/24 HOURS) 14 mg/24hr  patch    nicotine (NICODERM CQ - DOSED IN MG/24 HR) 7 mg/24hr patch  2. Chronic obstructive pulmonary disease, unspecified COPD type (HCC) J44.9 SYMBICORT 160-4.5 MCG/ACT inhaler  3. Type 2 diabetes mellitus with other specified complication, with long-term current use of insulin (HCC) E11.69    Z79.4   4. Hypertension associated with diabetes (HCC) E11.59    I10    Continue nicotine patch apply daily and taper off as directed (finish 21mg  dose then start 14mg  patch daily x 2 weeks then 7mg  daily x 2 weeks and stop) for complete smoking cessation  INCREASE LEVEMIR 24 UNITS DAILY   CHECK BLOOD SUGARS DAILY UNTIL THEY ARE CONSISTENTLY < 200. IF BLOOD SUGARS REMAIN > 200 IN NEXT WEEK, MAY NEED TO ADJUST INSULIN. PLEASE CALL OFFICE  Continue other medications as ordered  Follow up as scheduled with new PCP in November or sooner if need be.    Judy Fischer  [facility] [address] [place], [place] [address] [phone] Cell (Monday-Friday 8 AM - 5 PM) [phone] After 5 PM and follow prompts

## 2017-11-21 NOTE — Patient Instructions (Addendum)
Continue nicotine patch apply daily and taper off as directed (finish 21mg  dose then start 14mg  patch daily x 2 weeks then 7mg  daily x 2 weeks and stop) for complete smoking cessation  INCREASE LEVEMIR 24 UNITS DAILY   CHECK BLOOD SUGARS DAILY UNTIL THEY ARE CONSISTENTLY < 200. IF BLOOD SUGARS REMAIN > 200 IN NEXT WEEK, MAY NEED TO ADJUST INSULIN. PLEASE CALL OFFICE  Continue other medications as ordered  Follow up as scheduled with new PCP in November or sooner if need be.

## 2017-11-27 ENCOUNTER — Other Ambulatory Visit: Payer: Self-pay | Admitting: Internal Medicine

## 2017-12-05 ENCOUNTER — Ambulatory Visit: Payer: 59 | Admitting: Endocrinology

## 2017-12-08 ENCOUNTER — Ambulatory Visit: Payer: 59 | Admitting: Internal Medicine

## 2017-12-19 ENCOUNTER — Telehealth: Payer: Self-pay | Admitting: *Deleted

## 2017-12-19 MED ORDER — INSULIN DETEMIR 100 UNIT/ML FLEXPEN: 30.0000 [IU] | PEN_INJECTOR | Freq: Every day | SUBCUTANEOUS | 5 refills | Status: DC

## 2017-12-19 NOTE — Telephone Encounter (Signed)
increase levemir to 30 units qHS

## 2017-12-19 NOTE — Telephone Encounter (Signed)
Patient called and stated that she has been unsuccessful in lowering her blood sugar under 200. Lowest she had was 258. Been running mainly in the 300's.  Taking Levimer 24units at bedtime 12/19/17- 400  Please Advise.

## 2017-12-19 NOTE — Telephone Encounter (Signed)
Patient notified and agreed. Medication list updated.  

## 2018-01-15 ENCOUNTER — Ambulatory Visit: Payer: 59 | Admitting: Nurse Practitioner

## 2018-01-23 ENCOUNTER — Ambulatory Visit (INDEPENDENT_AMBULATORY_CARE_PROVIDER_SITE_OTHER): Payer: 59 | Admitting: Nurse Practitioner

## 2018-01-23 ENCOUNTER — Encounter: Payer: Self-pay | Admitting: Nurse Practitioner

## 2018-01-23 VITALS — BP 116/74 | HR 91 | Temp 98.9°F | Ht 63.0 in | Wt 233.0 lb

## 2018-01-23 DIAGNOSIS — J40 Bronchitis, not specified as acute or chronic: Secondary | ICD-10-CM

## 2018-01-23 DIAGNOSIS — K2100 Gastro-esophageal reflux disease with esophagitis, without bleeding: Secondary | ICD-10-CM

## 2018-01-23 DIAGNOSIS — L409 Psoriasis, unspecified: Secondary | ICD-10-CM

## 2018-01-23 DIAGNOSIS — R7989 Other specified abnormal findings of blood chemistry: Secondary | ICD-10-CM

## 2018-01-23 DIAGNOSIS — M2550 Pain in unspecified joint: Secondary | ICD-10-CM

## 2018-01-23 DIAGNOSIS — E119 Type 2 diabetes mellitus without complications: Secondary | ICD-10-CM

## 2018-01-23 DIAGNOSIS — Z794 Long term (current) use of insulin: Secondary | ICD-10-CM

## 2018-01-23 DIAGNOSIS — M791 Myalgia, unspecified site: Secondary | ICD-10-CM | POA: Diagnosis not present

## 2018-01-23 LAB — TSH: TSH: 0.81 u[IU]/mL (ref 0.35–4.50)

## 2018-01-23 LAB — BASIC METABOLIC PANEL
BUN: 11 mg/dL (ref 6–23)
CHLORIDE: 98 meq/L (ref 96–112)
CO2: 28 mEq/L (ref 19–32)
CREATININE: 0.79 mg/dL (ref 0.40–1.20)
Calcium: 10.5 mg/dL (ref 8.4–10.5)
GFR: 96.84 mL/min (ref >60.00)
Glucose, Bld: 394 mg/dL — ABNORMAL HIGH (ref 70–99)
Potassium: 4.2 mEq/L (ref 3.5–5.1)
Sodium: 136 mEq/L (ref 135–145)

## 2018-01-23 LAB — SEDIMENTATION RATE: SED RATE: 48 mm/h — AB (ref 0–30)

## 2018-01-23 LAB — C-REACTIVE PROTEIN: CRP: 2.7 mg/dL (ref 0.5–20.0)

## 2018-01-23 MED ORDER — ALBUTEROL SULFATE 108 (90 BASE) MCG/ACT IN AEPB: 1.0000 | INHALATION_SPRAY | Freq: Four times a day (QID) | RESPIRATORY_TRACT | 1 refills | Status: DC | PRN

## 2018-01-23 MED ORDER — OMEPRAZOLE 20 MG PO CPDR: 20.0000 mg | DELAYED_RELEASE_CAPSULE | Freq: Every day | ORAL | 3 refills | Status: DC

## 2018-01-23 MED ORDER — NYSTATIN-TRIAMCINOLONE 100000-0.1 UNIT/GM-% EX CREA: 1.0000 "application " | TOPICAL_CREAM | Freq: Two times a day (BID) | CUTANEOUS | 0 refills | Status: DC

## 2018-01-23 MED ORDER — GLIPIZIDE ER 2.5 MG PO TB24: 2.5000 mg | ORAL_TABLET | Freq: Every day | ORAL | 1 refills | Status: DC

## 2018-01-23 NOTE — Progress Notes (Signed)
Subjective:  Patient ID: Judy Fischer, female    DOB: April 04, 1960  Age: 57 y.o. MRN: [id number]  CC: Establish Care (est care/ pt is complaining of left arm pain,and joints pain/ going on 1 mo and half. hx of arthritis)  Transferring care from Dr. Montez Morita to me.  Muscle Pain  This is a chronic problem. The current episode started more than 1 year ago. The problem occurs intermittently. The problem has been waxing and waning since onset. The pain is present in the left shoulder, left arm, left wrist, right wrist, left hip, right hip and right knee. The pain is medium. The symptoms are aggravated by inactivity and any movement. Associated symptoms include stiffness. Pertinent negatives include no fatigue, fever, joint swelling, rash, sensory change, swollen glands or weakness. Past treatments include acetaminophen and OTC NSAID. The treatment provided mild relief. There is no swelling present. She has been behaving normally.  FHX of lupus (cousins) stiffness improves with repositioning and does not last more than .  DM: Glucose at home: 200s (fasting) Use of levemir 30units Unable to tolerate metformin (nausea)  Tobacco use: 1/2ppd >35yrs Weaning off with use of nicotine patch.  Last hospitalization 11/2017 x 3days due to COPD exacebation. Use of symbicort prn per patient.  Reviewed past Medical, Social and Family history today.  Outpatient Medications Prior to Visit  Medication Sig Dispense Refill  . aspirin 81 MG tablet Take 81 mg by mouth daily.    . diphenhydramine-acetaminophen (TYLENOL PM) 25-500 MG TABS tablet Take 1 tablet by mouth at bedtime as needed.    . fluticasone (FLONASE) 50 MCG/ACT nasal spray Place 1 spray into both nostrils daily. 16 g 3  . hydrochlorothiazide (HYDRODIURIL) 25 MG tablet Take 1 tablet (25 mg total) by mouth daily. 90 tablet 3  . Insulin Detemir (LEVEMIR FLEXTOUCH) 100 UNIT/ML Pen Inject 30 Units into the skin at bedtime. 15 mL 5  . Insulin Pen  Needle (NOVOFINE) 32G X 6 MM MISC Use once daily with administration of insulin DX E11.59 100 each 11  . ipratropium-albuterol (DUONEB) 0.5-2.5 (3) MG/3ML SOLN Take 3 mLs by nebulization every 6 (six) hours as needed.    . nicotine (NICODERM CQ - DOSED IN MG/24 HOURS) 14 mg/24hr patch Place 1 patch (14 mg total) onto the skin daily. START AFTER YOU COMPLETE 21MG  PATCH DOSE AND TAKE FOR 4 WEEKS 28 patch 0  . nicotine (NICODERM CQ - DOSED IN MG/24 HOURS) 21 mg/24hr patch Place 21 mg onto the skin daily.    . nicotine (NICODERM CQ - DOSED IN MG/24 HR) 7 mg/24hr patch Place 1 patch (7 mg total) onto the skin daily. START AFTER YOU COMPLETE 14 MG PATCH DOSE AND TAKE FOR 2 WEEKS 28 patch 0  . SYMBICORT 160-4.5 MCG/ACT inhaler Inhale 2 puffs into the lungs 2 (two) times daily. FOR COPD 1 Inhaler 6  . albuterol (PROVENTIL HFA;VENTOLIN HFA) 108 (90 Base) MCG/ACT inhaler Inhale 2 puffs into the lungs every 6 (six) hours as needed for wheezing or shortness of breath. 18 g 0  . nystatin-triamcinolone (MYCOLOG II) cream Apply 1 application topically 2 (two) times daily. 30 g 0   No facility-administered medications prior to visit.     ROS See HPI  Objective:  BP 116/74   Pulse 91   Temp 98.9 F (37.2 C) (Oral)   Ht 5\' 3"  (1.6 m)   Wt 233 lb (105.7 kg)   LMP 03/07/2006 (Approximate)   SpO2 93%  BMI 41.27 kg/m   BP Readings from Last 3 Encounters:  01/23/18 116/74  11/21/17 124/76  10/18/17 126/80    Wt Readings from Last 3 Encounters:  01/23/18 233 lb (105.7 kg)  11/21/17 234 lb 3.2 oz (106.2 kg)  10/18/17 243 lb (110.2 kg)    Physical Exam  Constitutional: She is oriented to person, place, and time. No distress.  Neck: Normal range of motion. Neck supple. No thyromegaly present.  Cardiovascular: Normal rate, regular rhythm and normal heart sounds.  Pulmonary/Chest: Effort normal and breath sounds normal. She has no wheezes. She has no rales.  Musculoskeletal: Normal range of motion.  She exhibits no edema, tenderness or deformity.  Lymphadenopathy:    She has no cervical adenopathy.  Neurological: She is alert and oriented to person, place, and time.  Skin: No rash noted.  Psychiatric: She has a normal mood and affect. Her behavior is normal. Thought content normal.    Lab Results  Component Value Date   WBC 12.3 11/07/2017   HGB 13.1 11/07/2017   HCT 39 11/07/2017   PLT 246 11/07/2017   GLUCOSE 394 (H) 01/23/2018   CHOL 135 11/06/2017   TRIG 67 11/06/2017   HDL 49 11/06/2017   LDLCALC 73 11/06/2017   ALT 11 11/07/2017   AST 8 (A) 11/07/2017   NA 136 01/23/2018   K 4.2 01/23/2018   CL 98 01/23/2018   CREATININE 0.79 01/23/2018   BUN 11 01/23/2018   CO2 28 01/23/2018   TSH 0.81 01/23/2018   INR 1.0 11/07/2017   HGBA1C 8.2 (A) 11/06/2017   MICROALBUR 1.1 03/31/2017    Mm 3d Screen Breast Bilateral  Result Date: 06/12/2017 CLINICAL DATA:  Screening. EXAM: DIGITAL SCREENING BILATERAL MAMMOGRAM WITH TOMO AND CAD COMPARISON:  Previous exam(s). ACR Breast Density Category c: The breast tissue is heterogeneously dense, which may obscure small masses. FINDINGS: There are no findings suspicious for malignancy. Images were processed with CAD. IMPRESSION: No mammographic evidence of malignancy. A result letter of this screening mammogram will be mailed directly to the patient. RECOMMENDATION: Screening mammogram in one year. (Code:SM-B-01Y) BI-RADS CATEGORY  1: Negative. Electronically Signed   By: Britta MccreedySusan  Turner M.D.   On: 06/12/2017 13:49    Assessment & Plan:   Magda Paganiniudrey was seen today for establish care.  Diagnoses and all orders for this visit:  Arthralgia, unspecified joint -     Basic metabolic panel -     Sedimentation rate -     C-reactive protein -     Antinuclear Antib (ANA)  Psoriasis -     nystatin-triamcinolone (MYCOLOG II) cream; Apply 1 application topically 2 (two) times daily.  Myalgia -     Basic metabolic panel -     Sedimentation rate -      C-reactive protein -     Antinuclear Antib (ANA)  Gastroesophageal reflux disease with esophagitis -     omeprazole (PRILOSEC) 20 MG capsule; Take 1 capsule (20 mg total) by mouth daily.  Abnormal TSH -     TSH  Bronchitis -     Albuterol Sulfate (PROAIR RESPICLICK) 108 (90 Base) MCG/ACT AEPB; Inhale 1 puff into the lungs every 6 (six) hours as needed.  Type 2 diabetes mellitus without complication, with long-term current use of insulin (HCC) -     glipiZIDE (GLUCOTROL XL) 2.5 MG 24 hr tablet; Take 1 tablet (2.5 mg total) by mouth daily with breakfast.   I have changed Magda PaganiniAudrey L. Salameh's albuterol to  Albuterol Sulfate. I am also having her start on omeprazole and glipiZIDE. Additionally, I am having her maintain her aspirin, diphenhydramine-acetaminophen, Insulin Pen Needle, fluticasone, hydrochlorothiazide, ipratropium-albuterol, nicotine, SYMBICORT, nicotine, nicotine, Insulin Detemir, and nystatin-triamcinolone.  Meds ordered this encounter  Medications  . Albuterol Sulfate (PROAIR RESPICLICK) 108 (90 Base) MCG/ACT AEPB    Sig: Inhale 1 puff into the lungs every 6 (six) hours as needed.    Dispense:  1 each    Refill:  1    Order Specific Question:   Supervising Provider    Answer:   MATTHEWS, CODY [[id number]]  . nystatin-triamcinolone (MYCOLOG II) cream    Sig: Apply 1 application topically 2 (two) times daily.    Dispense:  30 g    Refill:  0    Order Specific Question:   Supervising Provider    Answer:   MATTHEWS, CODY [[id number]]  . omeprazole (PRILOSEC) 20 MG capsule    Sig: Take 1 capsule (20 mg total) by mouth daily.    Dispense:  30 capsule    Refill:  3    Order Specific Question:   Supervising Provider    Answer:   MATTHEWS, CODY [[id number]]  . glipiZIDE (GLUCOTROL XL) 2.5 MG 24 hr tablet    Sig: Take 1 tablet (2.5 mg total) by mouth daily with breakfast.    Dispense:  30 tablet    Refill:  1    Order Specific Question:   Supervising Provider    Answer:   MATTHEWS, CODY  [[id number]]    Problem List Items Addressed This Visit      Endocrine   Type 2 diabetes mellitus without complication, with long-term current use of insulin (HCC)   Relevant Medications   glipiZIDE (GLUCOTROL XL) 2.5 MG 24 hr tablet     Musculoskeletal and Integument   Psoriasis   Relevant Medications   nystatin-triamcinolone (MYCOLOG II) cream    Other Visit Diagnoses    Arthralgia, unspecified joint    -  Primary   Relevant Orders   Basic metabolic panel (Completed)   Sedimentation rate (Completed)   C-reactive protein (Completed)   Antinuclear Antib (ANA)   Myalgia       Relevant Orders   Basic metabolic panel (Completed)   Sedimentation rate (Completed)   C-reactive protein (Completed)   Antinuclear Antib (ANA)   Gastroesophageal reflux disease with esophagitis       Relevant Medications   omeprazole (PRILOSEC) 20 MG capsule   Abnormal TSH       Relevant Orders   TSH (Completed)   Bronchitis       Relevant Medications   Albuterol Sulfate (PROAIR RESPICLICK) 108 (90 Base) MCG/ACT AEPB       Follow-up: Return in about 4 weeks (around 02/20/2018) for DM and HTN ( slot only).  Alysia Penna, NP

## 2018-01-23 NOTE — Patient Instructions (Addendum)
Normal labs except uncontrolled DM with glucose of 394. I added glyburide for DM.  Use symbicort as prescribed, rinse mouth after each use.

## 2018-01-25 LAB — ANA: Anti Nuclear Antibody(ANA): NEGATIVE

## 2018-02-21 ENCOUNTER — Telehealth: Payer: Self-pay | Admitting: Nurse Practitioner

## 2018-02-21 ENCOUNTER — Encounter: Payer: Self-pay | Admitting: Nurse Practitioner

## 2018-02-21 ENCOUNTER — Ambulatory Visit (INDEPENDENT_AMBULATORY_CARE_PROVIDER_SITE_OTHER): Payer: 59 | Admitting: Nurse Practitioner

## 2018-02-21 VITALS — BP 110/84 | HR 80 | Temp 98.9°F | Ht 63.0 in | Wt 236.0 lb

## 2018-02-21 DIAGNOSIS — I10 Essential (primary) hypertension: Secondary | ICD-10-CM

## 2018-02-21 DIAGNOSIS — Z794 Long term (current) use of insulin: Secondary | ICD-10-CM | POA: Diagnosis not present

## 2018-02-21 DIAGNOSIS — E1159 Type 2 diabetes mellitus with other circulatory complications: Secondary | ICD-10-CM

## 2018-02-21 DIAGNOSIS — R350 Frequency of micturition: Secondary | ICD-10-CM

## 2018-02-21 DIAGNOSIS — E119 Type 2 diabetes mellitus without complications: Secondary | ICD-10-CM

## 2018-02-21 DIAGNOSIS — M25561 Pain in right knee: Secondary | ICD-10-CM

## 2018-02-21 DIAGNOSIS — G8929 Other chronic pain: Secondary | ICD-10-CM

## 2018-02-21 LAB — POCT GLYCOSYLATED HEMOGLOBIN (HGB A1C): Hemoglobin A1C: 11.7 % — AB (ref 4.0–5.6)

## 2018-02-21 MED ORDER — FREESTYLE LIBRE 14 DAY SENSOR MISC: 1.0000 [IU] | Freq: Two times a day (BID) | 0 refills | Status: DC

## 2018-02-21 MED ORDER — FREESTYLE LIBRE 14 DAY READER DEVI: 1.0000 [IU] | 0 refills | Status: DC

## 2018-02-21 MED ORDER — EMPAGLIFLOZIN 10 MG PO TABS: 10.0000 mg | ORAL_TABLET | Freq: Every day | ORAL | 1 refills | Status: DC

## 2018-02-21 MED ORDER — HYDROCHLOROTHIAZIDE 12.5 MG PO TABS: 12.5000 mg | ORAL_TABLET | Freq: Every day | ORAL | 1 refills | Status: DC

## 2018-02-21 NOTE — Telephone Encounter (Signed)
PA for Jardiance approved, pharmacy notified.

## 2018-02-21 NOTE — Progress Notes (Signed)
Subjective:  Patient ID: Judy Fischer, female    DOB: 02-Jan-1961  Age: 57 y.o. MRN: [id number]  CC: Follow-up (3  mo fu/blood sugar run around 215) and Pain (right knee pain,going for a while. )  Knee Pain   Incident onset: chronic, onset several years. There was no injury mechanism. The pain is present in the right knee. The quality of the pain is described as aching. The pain has been intermittent since onset. Pertinent negatives include no muscle weakness, numbness or tingling. The symptoms are aggravated by movement and weight bearing. She has tried NSAIDs for the symptoms. The treatment provided significant relief.  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  DM: Uncontrolled. Home glucose: 200s. Current use of glipizide and levemir 30units. Unable to tolerate metformin (nausea). Denies any hypoglycemic episode. Has not made changes to diet.  no FHx of thyroid or pancreatic cancer. No hx of vaginitis or recurrent UTI. Reports she has met with nutritionist and does not wish to return Declined referral to weight loss clinic. BP controlled with HCTZ. BP Readings from Last 3 Encounters:  02/21/18 110/84  01/23/18 116/74  11/21/17 124/76   Reviewed past Medical, Social and Family history today.  Outpatient Medications Prior to Visit  Medication Sig Dispense Refill  . Albuterol Sulfate (PROAIR RESPICLICK) 003 (90 Base) MCG/ACT AEPB Inhale 1 puff into the lungs every 6 (six) hours as needed. 1 each 1  . aspirin 81 MG tablet Take 81 mg by mouth daily.    . diphenhydramine-acetaminophen (TYLENOL PM) 25-500 MG TABS tablet Take 1 tablet by mouth at bedtime as needed.    . fluticasone (FLONASE) 50 MCG/ACT nasal spray Place 1 spray into both nostrils daily. 16 g 3  . glipiZIDE (GLUCOTROL XL) 2.5 MG 24 hr tablet Take 1 tablet (2.5 mg total) by mouth daily with breakfast. 30 tablet 1  . Insulin Detemir (LEVEMIR FLEXTOUCH) 100 UNIT/ML Pen Inject 30 Units into the skin at bedtime. 15 mL 5   . Insulin Pen Needle (NOVOFINE) 32G X 6 MM MISC Use once daily with administration of insulin DX E11.59 100 each 11  . ipratropium-albuterol (DUONEB) 0.5-2.5 (3) MG/3ML SOLN Take 3 mLs by nebulization every 6 (six) hours as needed.    . nystatin-triamcinolone (MYCOLOG II) cream Apply 1 application topically 2 (two) times daily. 30 g 0  . omeprazole (PRILOSEC) 20 MG capsule Take 1 capsule (20 mg total) by mouth daily. 30 capsule 3  . SYMBICORT 160-4.5 MCG/ACT inhaler Inhale 2 puffs into the lungs 2 (two) times daily. FOR COPD 1 Inhaler 6  . hydrochlorothiazide (HYDRODIURIL) 25 MG tablet Take 1 tablet (25 mg total) by mouth daily. 90 tablet 3  . nicotine (NICODERM CQ - DOSED IN MG/24 HOURS) 14 mg/24hr patch Place 1 patch (14 mg total) onto the skin daily. START AFTER YOU COMPLETE 21MG PATCH DOSE AND TAKE FOR 4 WEEKS 28 patch 0  . nicotine (NICODERM CQ - DOSED IN MG/24 HOURS) 21 mg/24hr patch Place 21 mg onto the skin daily.    . nicotine (NICODERM CQ - DOSED IN MG/24 HR) 7 mg/24hr patch Place 1 patch (7 mg total) onto the skin daily. START AFTER YOU COMPLETE 14 MG PATCH DOSE AND TAKE FOR 2 WEEKS (Patient not taking: Reported on 02/21/2018) 28 patch 0   No facility-administered medications prior to visit.     ROS See HPI  Objective:  BP 110/84   Pulse 80   Temp 98.9 F (37.2 C) (Oral)  Ht _0  (1.6 m)   Wt 236 lb (107 kg)   LMP 03/07/2006 (Approximate)   SpO2 99%   BMI 41.81 kg/m   BP Readings from Last 3 Encounters:  02/21/18 110/84  01/23/18 116/74  11/21/17 124/76    Wt Readings from Last 3 Encounters:  02/21/18 236 lb (107 kg)  01/23/18 233 lb (105.7 kg)  11/21/17 234 lb 3.2 oz (106.2 kg)    Physical Exam Vitals signs reviewed.  Constitutional:      Appearance: She is obese.  Cardiovascular:     Rate and Rhythm: Normal rate.  Pulmonary:     Effort: Pulmonary effort is normal.  Musculoskeletal:     Right lower leg: No edema.  Skin:    General: Skin is warm and  dry.  Neurological:     Mental Status: She is alert.     Lab Results  Component Value Date   WBC 12.3 11/07/2017   HGB 13.1 11/07/2017   HCT 39 11/07/2017   PLT 246 11/07/2017   GLUCOSE 394 (H) 01/23/2018   CHOL 135 11/06/2017   TRIG 67 11/06/2017   HDL 49 11/06/2017   LDLCALC 73 11/06/2017   ALT 11 11/07/2017   AST 8 (A) 11/07/2017   NA 136 01/23/2018   K 4.2 01/23/2018   CL 98 01/23/2018   CREATININE 0.79 01/23/2018   BUN 11 01/23/2018   CO2 28 01/23/2018   TSH 0.81 01/23/2018   INR 1.0 11/07/2017   HGBA1C 11.7 (A) 02/21/2018   MICROALBUR 1.1 03/31/2017    Mm 3d Screen Breast Bilateral  Result Date: 06/12/2017 CLINICAL DATA:  Screening. EXAM: DIGITAL SCREENING BILATERAL MAMMOGRAM WITH TOMO AND CAD COMPARISON:  Previous exam(s). ACR Breast Density Category c: The breast tissue is heterogeneously dense, which may obscure small masses. FINDINGS: There are no findings suspicious for malignancy. Images were processed with CAD. IMPRESSION: No mammographic evidence of malignancy. A result letter of this screening mammogram will be mailed directly to the patient. RECOMMENDATION: Screening mammogram in one year. (Code:[id number]-01Y) BI-RADS CATEGORY  1: Negative. Electronically Signed   By: Curlene Dolphin M.D.   On: 06/12/2017 13:49    Assessment & Plan:   Judy Fischer was seen today for follow-up and pain.  Diagnoses and all orders for this visit:  Type 2 diabetes mellitus without complication, with long-term current use of insulin (HCC) -     POCT glycosylated hemoglobin (Hb A1C) -     Discontinue: empagliflozin (JARDIANCE) 10 MG TABS tablet; Take 10 mg by mouth daily. -     Continuous Blood Gluc Receiver (FREESTYLE LIBRE 14 DAY READER) DEVI; 1 Units by Does not apply route once a week. -     Continuous Blood Gluc Sensor (FREESTYLE LIBRE 14 DAY SENSOR) MISC; 1 Units by Does not apply route 2 (two) times daily. -     empagliflozin (JARDIANCE) 10 MG TABS tablet; Take 10 mg by mouth at  bedtime.  Urinary frequency -     Urinalysis w microscopic + reflex cultur  Hypertension associated with diabetes (HCC) -     hydrochlorothiazide (HYDRODIURIL) 12.5 MG tablet; Take 1 tablet (12.5 mg total) by mouth daily.   I have discontinued Blakley L. Grunow's nicotine, nicotine, and nicotine. I have also changed her hydrochlorothiazide and empagliflozin. Additionally, I am having her start on FREESTYLE LIBRE 14 DAY READER and FREESTYLE LIBRE 14 DAY SENSOR. Lastly, I am having her maintain her aspirin, diphenhydramine-acetaminophen, Insulin Pen Needle, fluticasone, ipratropium-albuterol, SYMBICORT, Insulin Detemir, Albuterol  Sulfate, nystatin-triamcinolone, omeprazole, and glipiZIDE.  Meds ordered this encounter  Medications  . DISCONTD: empagliflozin (JARDIANCE) 10 MG TABS tablet    Sig: Take 10 mg by mouth daily.    Dispense:  30 tablet    Refill:  1    Order Specific Question:   Supervising Provider    Answer:   Lucille Passy [[id number]]  . Continuous Blood Gluc Receiver (FREESTYLE LIBRE 14 DAY READER) DEVI    Sig: 1 Units by Does not apply route once a week.    Dispense:  1 Device    Refill:  0    Order Specific Question:   Supervising Provider    Answer:   Lucille Passy [[id number]]  . Continuous Blood Gluc Sensor (FREESTYLE LIBRE 14 DAY SENSOR) MISC    Sig: 1 Units by Does not apply route 2 (two) times daily.    Dispense:  2 each    Refill:  0    Order Specific Question:   Supervising Provider    Answer:   Lucille Passy [[id number]]  . hydrochlorothiazide (HYDRODIURIL) 12.5 MG tablet    Sig: Take 1 tablet (12.5 mg total) by mouth daily.    Dispense:  30 tablet    Refill:  1    Order Specific Question:   Supervising Provider    Answer:   Lucille Passy [[id number]]  . empagliflozin (JARDIANCE) 10 MG TABS tablet    Sig: Take 10 mg by mouth at bedtime.    Dispense:  30 tablet    Refill:  1    Order Specific Question:   Supervising Provider    Answer:   Lucille Passy [[id number]]    Problem List  Items Addressed This Visit      Cardiovascular and Mediastinum   Hypertension associated with diabetes ([place])   Relevant Medications   hydrochlorothiazide (HYDRODIURIL) 12.5 MG tablet   empagliflozin (JARDIANCE) 10 MG TABS tablet     Endocrine   Type 2 diabetes mellitus without complication, with long-term current use of insulin ([place]) - Primary    Uncontrolled. HgbA1c of 11  Added jardiance Obtained UA to rule out UTI. Decrease HCTZ to 12.73m Maintained glipizide and levemir. Advised amount importance to maintain adequate oral hydration with water (60oz per day). F/up in       Relevant Medications   Continuous Blood Gluc Receiver (FREESTYLE LIBRE 14 DAY READER) DEVI   Continuous Blood Gluc Sensor (FREESTYLE LIBRE 14 DAY SENSOR) MISC   empagliflozin (JARDIANCE) 10 MG TABS tablet   Other Relevant Orders   POCT glycosylated hemoglobin (Hb A1C) (Completed)    Other Visit Diagnoses    Urinary frequency       Relevant Orders   Urinalysis w microscopic + reflex cultur       Follow-up: Return in about 2 weeks (around 03/07/2018) for DM and HTN (352ms).  ChWilfred LacyNP

## 2018-02-21 NOTE — Assessment & Plan Note (Addendum)
Uncontrolled. HgbA1c of 11  Added jardiance Obtained UA to rule out UTI. Decrease HCTZ to 12.5mg  Maintained glipizide and levemir. Advised amount importance to maintain adequate oral hydration with water (60oz per day). F/up in 2weeks

## 2018-02-21 NOTE — Patient Instructions (Addendum)
Need to check glucose morning and evening (before breakfast and supper).  Very important to make necessary changes to diet and start regula exercise to help improve glucose control  Continue with topical cream and tylenol for right knee pain. Let me know if you change your mind about referral to sports medicine.  Go to lab for urine collection.  Start jardiance at supper time. Maintain current dose of glipizide and levemir.  Decrease HCTZ to 12.5mg .  Empagliflozin oral tablets What is this medicine? EMPAGLIFLOZIN (EM pa gli FLOE zin) helps to treat type 2 diabetes. It helps to control blood sugar. This drug may also reduce the risk of heart attack or stroke if you have type 2 diabetes and risk factors for heart disease. Treatment is combined with diet and exercise. This medicine may be used for other purposes; ask your health care provider or pharmacist if you have questions. COMMON BRAND NAME(S): JARDIANCE What should I tell my health care provider before I take this medicine? They need to know if you have any of these conditions: -dehydration -diabetic ketoacidosis -diet low in salt -eating less due to illness, surgery, dieting, or any other reason -having surgery -high cholesterol -high levels of potassium in the blood -history of pancreatitis or pancreas problems -history of yeast infection of the penis or vagina -if you often drink alcohol -infections in the bladder, kidneys, or urinary tract -kidney disease -liver disease -low blood pressure -on hemodialysis -problems urinating -type 1 diabetes -uncircumcised female -an unusual or allergic reaction to empagliflozin, other medicines, foods, dyes, or preservatives -pregnant or trying to get pregnant -breast-feeding How should I use this medicine? Take this medicine by mouth with a glass of water. Follow the directions on the prescription label. Take it in the morning, with or without food. Take your dose at the same time  each day. Do not take more often than directed. Do not stop taking except on your doctor's advice. Talk to your pediatrician regarding the use of this medicine in children. Special care may be needed. Overdosage: If you think you have taken too much of this medicine contact a poison control center or emergency room at once. NOTE: This medicine is only for you. Do not share this medicine with others. What if I miss a dose? If you miss a dose, take it as soon as you can. If it is almost time for your next dose, take only that dose. Do not take double or extra doses. What may interact with this medicine? Do not take this medicine with any of the following medications: -gatifloxacin This medicine may also interact with the following medications: -alcohol -certain medicines for blood pressure, heart disease -diuretics This list may not describe all possible interactions. Give your health care provider a list of all the medicines, herbs, non-prescription drugs, or dietary supplements you use. Also tell them if you smoke, drink alcohol, or use illegal drugs. Some items may interact with your medicine. What should I watch for while using this medicine? Visit your doctor or health care professional for regular checks on your progress. This medicine can cause a serious condition in which there is too much acid in the blood. If you develop nausea, vomiting, stomach pain, unusual tiredness, or breathing problems, stop taking this medicine and call your doctor right away. If possible, use a ketone dipstick to check for ketones in your urine. A test called the HbA1C (A1C) will be monitored. This is a simple blood test. It measures your blood sugar  control over the last 2 to 3 months. You will receive this test every 3 to 6 months. Learn how to check your blood sugar. Learn the symptoms of low and high blood sugar and how to manage them. Always carry a quick-source of sugar with you in case you have symptoms of  low blood sugar. Examples include hard sugar candy or glucose tablets. Make sure others know that you can choke if you eat or drink when you develop serious symptoms of low blood sugar, such as seizures or unconsciousness. They must get medical help at once. Tell your doctor or health care professional if you have high blood sugar. You might need to change the dose of your medicine. If you are sick or exercising more than usual, you might need to change the dose of your medicine. Do not skip meals. Ask your doctor or health care professional if you should avoid alcohol. Many nonprescription cough and cold products contain sugar or alcohol. These can affect blood sugar. Wear a medical ID bracelet or chain, and carry a card that describes your disease and details of your medicine and dosage times. What side effects may I notice from receiving this medicine? Side effects that you should report to your doctor or health care professional as soon as possible: -allergic reactions like skin rash, itching or hives, swelling of the face, lips, or tongue -breathing problems -dizziness -feeling faint or lightheaded, falls -muscle weakness -nausea, vomiting, unusual stomach upset or pain -penile discharge, itching, or pain in men -signs and symptoms of a genital infection, such as fever; tenderness, redness, or swelling in the genitals or area from the genitals to the back of the rectum -signs and symptoms of low blood sugar such as feeling anxious, confusion, dizziness, increased hunger, unusually weak or tired, sweating, shakiness, cold, irritable, headache, blurred vision, fast heartbeat, loss of consciousness -signs and symptoms of a urinary tract infection, such as fever, chills, a burning feeling when urinating, blood in the urine, back pain -trouble passing urine or change in the amount of urine, including an urgent need to urinate more often, in larger amounts, or at night -unusual tiredness -vaginal  discharge, itching, or odor in women Side effects that usually do not require medical attention (report to your doctor or health care professional if they continue or are bothersome): -mild increase in urination -thirsty This list may not describe all possible side effects. Call your doctor for medical advice about side effects. You may report side effects to FDA at 1-800-FDA-1088. Where should I keep my medicine? Keep out of the reach of children. Store at room temperature between 20 and 25 degrees C (68 and 77 degrees F). Throw away any unused medicine after the expiration date. NOTE: This sheet is a summary. It may not cover all possible information. If you have questions about this medicine, talk to your doctor, pharmacist, or health care provider.  2019 Elsevier/Gold Standard (2016-11-03 10:25:34)   Diabetes Mellitus and Nutrition, Adult When you have diabetes (diabetes mellitus), it is very important to have healthy eating habits because your blood sugar (glucose) levels are greatly affected by what you eat and drink. Eating healthy foods in the appropriate amounts, at about the same times every day, can help you:  Control your blood glucose.  Lower your risk of heart disease.  Improve your blood pressure.  Reach or maintain a healthy weight. Every person with diabetes is different, and each person has different needs for a meal plan. Your health  care provider may recommend that you work with a diet and nutrition specialist (dietitian) to make a meal plan that is best for you. Your meal plan may vary depending on factors such as:  The calories you need.  The medicines you take.  Your weight.  Your blood glucose, blood pressure, and cholesterol levels.  Your activity level.  Other health conditions you have, such as heart or kidney disease. How do carbohydrates affect me? Carbohydrates, also called carbs, affect your blood glucose level more than any other type of food.  Eating carbs naturally raises the amount of glucose in your blood. Carb counting is a method for keeping track of how many carbs you eat. Counting carbs is important to keep your blood glucose at a healthy level, especially if you use insulin or take certain oral diabetes medicines. It is important to know how many carbs you can safely have in each meal. This is different for every person. Your dietitian can help you calculate how many carbs you should have at each meal and for each snack. Foods that contain carbs include:  Bread, cereal, rice, pasta, and crackers.  Potatoes and corn.  Peas, beans, and lentils.  Milk and yogurt.  Fruit and juice.  Desserts, such as cakes, cookies, ice cream, and candy. How does alcohol affect me? Alcohol can cause a sudden decrease in blood glucose (hypoglycemia), especially if you use insulin or take certain oral diabetes medicines. Hypoglycemia can be a life-threatening condition. Symptoms of hypoglycemia (sleepiness, dizziness, and confusion) are similar to symptoms of having too much alcohol. If your health care provider says that alcohol is safe for you, follow these guidelines:  Limit alcohol intake to no more than 1 drink per day for nonpregnant women and 2 drinks per day for men. One drink equals 12 oz of beer, 5 oz of wine, or 1 oz of hard liquor.  Do not drink on an empty stomach.  Keep yourself hydrated with water, diet soda, or unsweetened iced tea.  Keep in mind that regular soda, juice, and other mixers may contain a lot of sugar and must be counted as carbs. What are tips for following this plan?  Reading food labels  Start by checking the serving size on the "Nutrition Facts" label of packaged foods and drinks. The amount of calories, carbs, fats, and other nutrients listed on the label is based on one serving of the item. Many items contain more than one serving per package.  Check the total grams (g) of carbs in one serving. You can  calculate the number of servings of carbs in one serving by dividing the total carbs by 15. For example, if a food has 30 g of total carbs, it would be equal to 2 servings of carbs.  Check the number of grams (g) of saturated and trans fats in one serving. Choose foods that have low or no amount of these fats.  Check the number of milligrams (mg) of salt (sodium) in one serving. Most people should limit total sodium intake to less than 2,300 mg per day.  Always check the nutrition information of foods labeled as "low-fat" or "nonfat". These foods may be higher in added sugar or refined carbs and should be avoided.  Talk to your dietitian to identify your daily goals for nutrients listed on the label. Shopping  Avoid buying canned, premade, or processed foods. These foods tend to be high in fat, sodium, and added sugar.  Shop around the outside edge  of the grocery store. This includes fresh fruits and vegetables, bulk grains, fresh meats, and fresh dairy. Cooking  Use low-heat cooking methods, such as baking, instead of high-heat cooking methods like deep frying.  Cook using healthy oils, such as olive, canola, or sunflower oil.  Avoid cooking with butter, cream, or high-fat meats. Meal planning  Eat meals and snacks regularly, preferably at the same times every day. Avoid going long periods of time without eating.  Eat foods high in fiber, such as fresh fruits, vegetables, beans, and whole grains. Talk to your dietitian about how many servings of carbs you can eat at each meal.  Eat 4-6 ounces (oz) of lean protein each day, such as lean meat, chicken, fish, eggs, or tofu. One oz of lean protein is equal to: ? 1 oz of meat, chicken, or fish. ? 1 egg. ?  cup of tofu.  Eat some foods each day that contain healthy fats, such as avocado, nuts, seeds, and fish. Lifestyle  Check your blood glucose regularly.  Exercise regularly as told by your health care provider. This may  include: ? 150 minutes of moderate-intensity or vigorous-intensity exercise each week. This could be brisk walking, biking, or water aerobics. ? Stretching and doing strength exercises, such as yoga or weightlifting, at least 2 times a week.  Take medicines as told by your health care provider.  Do not use any products that contain nicotine or tobacco, such as cigarettes and e-cigarettes. If you need help quitting, ask your health care provider.  Work with a Veterinary surgeoncounselor or diabetes educator to identify strategies to manage stress and any emotional and social challenges. Questions to ask a health care provider  Do I need to meet with a diabetes educator?  Do I need to meet with a dietitian?  What number can I call if I have questions?  When are the best times to check my blood glucose? Where to find more information:  American Diabetes Association: diabetes.org  Academy of Nutrition and Dietetics: www.eatright.[employer]  National Institute of Diabetes and Digestive and Kidney Diseases (NIH): [employer].tnwww.niddk.nih.gov Summary  A healthy meal plan will help you control your blood glucose and maintain a healthy lifestyle.  Working with a diet and nutrition specialist (dietitian) can help you make a meal plan that is best for you.  Keep in mind that carbohydrates (carbs) and alcohol have immediate effects on your blood glucose levels. It is important to count carbs and to use alcohol carefully. This information is not intended to replace advice given to you by your health care provider. Make sure you discuss any questions you have with your health care provider. Document Released: 11/18/2004 Document Revised: 09/21/2016 Document Reviewed: 03/28/2016 Elsevier Interactive Patient Education  2019 [employer]

## 2018-02-23 LAB — URINALYSIS W MICROSCOPIC + REFLEX CULTURE
Bilirubin Urine: NEGATIVE
HGB URINE DIPSTICK: NEGATIVE
HYALINE CAST: NONE SEEN /LPF
Ketones, ur: NEGATIVE
Leukocyte Esterase: NEGATIVE
NITRITES URINE, INITIAL: NEGATIVE
PROTEIN: NEGATIVE
Specific Gravity, Urine: 1.027 (ref 1.001–1.03)
WBC, UA: NONE SEEN /HPF (ref 0–5)
pH: 7.5 (ref 5.0–8.0)

## 2018-02-23 LAB — CULTURE INDICATED

## 2018-02-23 LAB — URINE CULTURE
MICRO NUMBER:: 91520209
Result:: NO GROWTH
SPECIMEN QUALITY:: ADEQUATE

## 2018-03-09 ENCOUNTER — Encounter: Payer: Self-pay | Admitting: Nurse Practitioner

## 2018-03-09 ENCOUNTER — Ambulatory Visit (INDEPENDENT_AMBULATORY_CARE_PROVIDER_SITE_OTHER): Payer: 59 | Admitting: Nurse Practitioner

## 2018-03-09 VITALS — BP 118/80 | HR 85 | Temp 98.2°F | Ht 63.0 in | Wt 234.6 lb

## 2018-03-09 DIAGNOSIS — Z794 Long term (current) use of insulin: Secondary | ICD-10-CM

## 2018-03-09 DIAGNOSIS — E119 Type 2 diabetes mellitus without complications: Secondary | ICD-10-CM | POA: Diagnosis not present

## 2018-03-09 NOTE — Patient Instructions (Signed)
Maintain current medications Continue glucose check BID  Implement hypoglycemia protocol if glucose <90 Call office if glucose <100 or >250 for more than 2days.   Hypoglycemia Hypoglycemia occurs when the level of sugar (glucose) in the blood is too low. Hypoglycemia can happen in people who do or do not have diabetes. It can develop quickly, and it can be a medical emergency. For most people with diabetes, a blood glucose level below 70 mg/dL (3.9 mmol/L) is considered hypoglycemia. Glucose is a type of sugar that provides the body's main source of energy. Certain hormones (insulin and glucagon) control the level of glucose in the blood. Insulin lowers blood glucose, and glucagon raises blood glucose. Hypoglycemia can result from having too much insulin in the bloodstream, or from not eating enough food that contains glucose. You may also have reactive hypoglycemia, which happens within 4 hours after eating a meal. What are the causes? Hypoglycemia occurs most often in people who have diabetes and may be caused by:  Diabetes medicine.  Not eating enough, or not eating often enough.  Increased physical activity.  Drinking alcohol on an empty stomach. If you do not have diabetes, hypoglycemia may be caused by:  A tumor in the pancreas.  Not eating enough, or not eating for long periods at a time (fasting).  A severe infection or illness.  Certain medicines. What increases the risk? Hypoglycemia is more likely to develop in:  People who have diabetes and take medicines to lower blood glucose.  People who abuse alcohol.  People who have a severe illness. What are the signs or symptoms? Mild symptoms Mild hypoglycemia may not cause any symptoms. If you do have symptoms, they may include:  Hunger.  Anxiety.  Sweating and feeling clammy.  Dizziness or feeling light-headed.  Sleepiness.  Nausea.  Increased heart rate.  Headache.  Blurry  vision.  Irritability.  Tingling or numbness around the mouth, lips, or tongue.  A change in coordination.  Restless sleep. Moderate symptoms Moderate hypoglycemia can cause:  Mental confusion and poor judgment.  Behavior changes.  Weakness.  Irregular heartbeat. Severe symptoms Severe hypoglycemia is a medical emergency. It can cause:  Fainting.  Seizures.  Loss of consciousness (coma).  Death. How is this diagnosed? Hypoglycemia is diagnosed with a blood test to measure your blood glucose level. This blood test is done while you are having symptoms. Your health care provider may also do a physical exam and review your medical history. How is this treated? This condition can often be treated by immediately eating or drinking something that contains sugar, such as:  Fruit juice, 4-6 oz (120-150 mL).  Regular soda (not diet soda), 4-6 oz (120-150 mL).  Low-fat milk, 4 oz (120 mL).  Several pieces of hard candy.  Sugar or honey, 1 Tbsp (15 mL). Treating hypoglycemia if you have diabetes If you are alert and able to swallow safely, follow the 15:15 rule:  Take 15 grams of a rapid-acting carbohydrate. Talk with your health care provider about how much you should take.  Rapid-acting options include: ? Glucose pills (take 15 grams). ? 6-8 pieces of hard candy. ? 4-6 oz (120-150 mL) of fruit juice. ? 4-6 oz (120-150 mL) of regular (not diet) soda. ? 1 Tbsp (15 mL) honey or sugar.  Check your blood glucose 15 minutes after you take the carbohydrate.  If the repeat blood glucose level is still at or below 70 mg/dL (3.9 mmol/L), take 15 grams of a carbohydrate again.  If your blood glucose level does not increase above 70 mg/dL (3.9 mmol/L) after 3 tries, seek emergency medical care.  After your blood glucose level returns to normal, eat a meal or a snack within 1 hour.  Treating severe hypoglycemia Severe hypoglycemia is when your blood glucose level is at or  below 54 mg/dL (3 mmol/L). Severe hypoglycemia is a medical emergency. Get medical help right away. If you have severe hypoglycemia and you cannot eat or drink, you may need an injection of glucagon. A family member or close friend should learn how to check your blood glucose and how to give you a glucagon injection. Ask your health care provider if you need to have an emergency glucagon injection kit available. Severe hypoglycemia may need to be treated in a hospital. The treatment may include getting glucose through an IV. You may also need treatment for the cause of your hypoglycemia. Follow these instructions at home:  General instructions  Take over-the-counter and prescription medicines only as told by your health care provider.  Monitor your blood glucose as told by your health care provider.  Limit alcohol intake to no more than 1 drink a day for nonpregnant women and 2 drinks a day for men. One drink equals 12 oz of beer (355 mL), 5 oz of wine (148 mL), or 1 oz of hard liquor (44 mL).  Keep all follow-up visits as told by your health care provider. This is important. If you have diabetes:  Always have a rapid-acting carbohydrate snack with you to treat low blood glucose.  Follow your diabetes management plan as directed. Make sure you: ? Know the symptoms of hypoglycemia. It is important to treat it right away to prevent it from becoming severe. ? Take your medicines as directed. ? Follow your exercise plan. ? Follow your meal plan. Eat on time, and do not skip meals. ? Check your blood glucose as often as directed. Always check before and after exercise. ? Follow your sick day plan whenever you cannot eat or drink normally. Make this plan in advance with your health care provider.  Share your diabetes management plan with people in your workplace, school, and household.  Check your urine for ketones when you are ill and as told by your health care provider.  Carry a medical  alert card or wear medical alert jewelry. Contact a health care provider if:  You have problems keeping your blood glucose in your target range.  You have frequent episodes of hypoglycemia. Get help right away if:  You continue to have hypoglycemia symptoms after eating or drinking something containing glucose.  Your blood glucose is at or below 54 mg/dL (3 mmol/L).  You have a seizure.  You faint. These symptoms may represent a serious problem that is an emergency. Do not wait to see if the symptoms will go away. Get medical help right away. Call your local emergency services (911 in the [place] Summary  Hypoglycemia occurs when the level of sugar (glucose) in the blood is too low.  Hypoglycemia can happen in people who do or do not have diabetes. It can develop quickly, and it can be a medical emergency.  Make sure you know the symptoms of hypoglycemia and how to treat it.  Always have a rapid-acting carbohydrate snack with you to treat low blood sugar. This information is not intended to replace advice given to you by your health care provider. Make sure you discuss any questions you have with your health   care provider. Document Released: 02/21/2005 Document Revised: 08/15/2017 Document Reviewed: 03/27/2015 Elsevier Interactive Patient Education  2019 [employer].

## 2018-03-09 NOTE — Progress Notes (Signed)
Subjective:  Patient ID: Judy Fischer, female    DOB: 07/23/1960  Age: 58 y.o. MRN: [id number]  CC: Fischer (F/U DM an HTN, brought copies of readings)   HPI   DM: AM: 150-200 PM: 250-300 No hypoglycemia, no dysuria, no vaginal symptoms. Current use of glipizide, levemir and jardiance. Denies any adverse effects with jardiance. Last HgbA1c of 11.7  HTN: Controlled with HCTZ. BP Readings from Last 3 Encounters:  03/09/18 118/80  02/21/18 110/84  01/23/18 116/74    Reviewed past Medical, Social and Family history today.  Outpatient Medications Prior to Visit  Medication Sig Dispense Refill  . Albuterol Sulfate (PROAIR RESPICLICK) 108 (90 Base) MCG/ACT AEPB Inhale 1 puff into the lungs every 6 (six) hours as needed. 1 each 1  . aspirin 81 MG tablet Take 81 mg by mouth daily.    . Continuous Blood Gluc Receiver (FREESTYLE LIBRE 14 DAY READER) DEVI 1 Units by Does not apply route once a week. 1 Device 0  . Continuous Blood Gluc Sensor (FREESTYLE LIBRE 14 DAY SENSOR) MISC 1 Units by Does not apply route 2 (two) times daily. 2 each 0  . diphenhydramine-acetaminophen (TYLENOL PM) 25-500 MG TABS tablet Take 1 tablet by mouth at bedtime as needed.    . empagliflozin (JARDIANCE) 10 MG TABS tablet Take 10 mg by mouth at bedtime. 30 tablet 1  . fluticasone (FLONASE) 50 MCG/ACT nasal spray Place 1 spray into both nostrils daily. 16 g 3  . glipiZIDE (GLUCOTROL XL) 2.5 MG 24 hr tablet Take 1 tablet (2.5 mg total) by mouth daily with breakfast. 30 tablet 1  . hydrochlorothiazide (HYDRODIURIL) 12.5 MG tablet Take 1 tablet (12.5 mg total) by mouth daily. 30 tablet 1  . Insulin Detemir (LEVEMIR FLEXTOUCH) 100 UNIT/ML Pen Inject 30 Units into the skin at bedtime. 15 mL 5  . Insulin Pen Needle (NOVOFINE) 32G X 6 MM MISC Use once daily with administration of insulin DX E11.59 100 each 11  . ipratropium-albuterol (DUONEB) 0.5-2.5 (3) MG/3ML SOLN Take 3 mLs by nebulization every 6 (six) hours as  needed.    . nystatin-triamcinolone (MYCOLOG II) cream Apply 1 application topically 2 (two) times daily. 30 g 0  . omeprazole (PRILOSEC) 20 MG capsule Take 1 capsule (20 mg total) by mouth daily. 30 capsule 3  . SYMBICORT 160-4.5 MCG/ACT inhaler Inhale 2 puffs into the lungs 2 (two) times daily. FOR COPD 1 Inhaler 6   No facility-administered medications prior to visit.     ROS See HPI  Objective:  BP 118/80   Pulse 85   Temp 98.2 F (36.8 C) (Oral)   Ht 5\' 3"  (1.6 m)   Wt 234 lb 9.6 oz (106.4 kg)   LMP 03/07/2006 (Approximate)   SpO2 95%   BMI 41.56 kg/m   BP Readings from Last 3 Encounters:  03/09/18 118/80  02/21/18 110/84  01/23/18 116/74    Wt Readings from Last 3 Encounters:  03/09/18 234 lb 9.6 oz (106.4 kg)  02/21/18 236 lb (107 kg)  01/23/18 233 lb (105.7 kg)    Physical Exam Vitals signs reviewed.  Cardiovascular:     Rate and Rhythm: Normal rate.  Pulmonary:     Effort: Pulmonary effort is normal.  Neurological:     Mental Status: She is alert and oriented to person, place, and time.    Lab Results  Component Value Date   WBC 12.3 11/07/2017   HGB 13.1 11/07/2017   HCT 39 11/07/2017  PLT 246 11/07/2017   GLUCOSE 394 (H) 01/23/2018   CHOL 135 11/06/2017   TRIG 67 11/06/2017   HDL 49 11/06/2017   LDLCALC 73 11/06/2017   ALT 11 11/07/2017   AST 8 (A) 11/07/2017   NA 136 01/23/2018   K 4.2 01/23/2018   CL 98 01/23/2018   CREATININE 0.79 01/23/2018   BUN 11 01/23/2018   CO2 28 01/23/2018   TSH 0.81 01/23/2018   INR 1.0 11/07/2017   HGBA1C 11.7 (A) 02/21/2018   MICROALBUR 1.1 03/31/2017    Mm 3d Screen Breast Bilateral  Result Date: 06/12/2017 CLINICAL DATA:  Screening. EXAM: DIGITAL SCREENING BILATERAL MAMMOGRAM WITH TOMO AND CAD COMPARISON:  Previous exam(s). ACR Breast Density Category c: The breast tissue is heterogeneously dense, which may obscure small masses. FINDINGS: There are no findings suspicious for malignancy. Images were  processed with CAD. IMPRESSION: No mammographic evidence of malignancy. A result letter of this screening mammogram will be mailed directly to the patient. RECOMMENDATION: Screening mammogram in one year. (Code:SM-B-01Y) BI-RADS CATEGORY  1: Negative. Electronically Signed   By: Britta MccreedySusan  Turner M.D.   On: 06/12/2017 13:49    Assessment & Plan:   Judy Fischer.  Diagnoses and all orders for this visit:  Type 2 diabetes mellitus without complication, with long-term current use of insulin (HCC)   I am having Judy Fischer maintain her aspirin, diphenhydramine-acetaminophen, Insulin Pen Needle, fluticasone, ipratropium-albuterol, SYMBICORT, Insulin Detemir, Albuterol Sulfate, nystatin-triamcinolone, omeprazole, glipiZIDE, FREESTYLE LIBRE 14 DAY READER, FREESTYLE LIBRE 14 DAY SENSOR, hydrochlorothiazide, and empagliflozin.  No orders of the defined types were placed in this encounter.   Problem List Items Addressed This Visit      Endocrine   Type 2 diabetes mellitus without complication, with long-term current use of insulin (HCC) - Primary       Fischer: Return in about 4 weeks (around 04/06/2018) for DM and HTN (30mins, needs repeat BMP).  Judy Pennaharlotte Jervon Ream, NP

## 2018-03-28 ENCOUNTER — Other Ambulatory Visit: Payer: Self-pay | Admitting: Nurse Practitioner

## 2018-03-28 DIAGNOSIS — E119 Type 2 diabetes mellitus without complications: Secondary | ICD-10-CM

## 2018-03-28 DIAGNOSIS — Z794 Long term (current) use of insulin: Principal | ICD-10-CM

## 2018-04-09 ENCOUNTER — Ambulatory Visit: Payer: 59 | Admitting: Nurse Practitioner

## 2018-04-16 ENCOUNTER — Inpatient Hospital Stay (HOSPITAL_COMMUNITY)
Admission: EM | Admit: 2018-04-16 | Discharge: 2018-04-18 | DRG: 871 | Disposition: A | Discharge status: Home / Self Care | Payer: 59 | Attending: Internal Medicine | Admitting: Internal Medicine

## 2018-04-16 ENCOUNTER — Encounter (HOSPITAL_COMMUNITY): Payer: Self-pay | Admitting: Emergency Medicine

## 2018-04-16 ENCOUNTER — Encounter: Payer: Self-pay | Admitting: Nurse Practitioner

## 2018-04-16 ENCOUNTER — Other Ambulatory Visit: Payer: Self-pay

## 2018-04-16 ENCOUNTER — Encounter (HOSPITAL_COMMUNITY): Payer: Self-pay

## 2018-04-16 ENCOUNTER — Ambulatory Visit (INDEPENDENT_AMBULATORY_CARE_PROVIDER_SITE_OTHER): Payer: 59 | Admitting: Nurse Practitioner

## 2018-04-16 ENCOUNTER — Emergency Department (HOSPITAL_COMMUNITY): Payer: 59

## 2018-04-16 ENCOUNTER — Ambulatory Visit: Payer: 59 | Admitting: Nurse Practitioner

## 2018-04-16 ENCOUNTER — Ambulatory Visit: Payer: Self-pay

## 2018-04-16 VITALS — BP 144/78 | HR 105 | Temp 101.4°F | Ht 63.0 in | Wt 239.6 lb

## 2018-04-16 DIAGNOSIS — K219 Gastro-esophageal reflux disease without esophagitis: Secondary | ICD-10-CM | POA: Diagnosis present

## 2018-04-16 DIAGNOSIS — I152 Hypertension secondary to endocrine disorders: Secondary | ICD-10-CM | POA: Diagnosis present

## 2018-04-16 DIAGNOSIS — R0902 Hypoxemia: Secondary | ICD-10-CM

## 2018-04-16 DIAGNOSIS — Z8249 Family history of ischemic heart disease and other diseases of the circulatory system: Secondary | ICD-10-CM

## 2018-04-16 DIAGNOSIS — J449 Chronic obstructive pulmonary disease, unspecified: Secondary | ICD-10-CM | POA: Diagnosis present

## 2018-04-16 DIAGNOSIS — Z794 Long term (current) use of insulin: Secondary | ICD-10-CM

## 2018-04-16 DIAGNOSIS — J101 Influenza due to other identified influenza virus with other respiratory manifestations: Secondary | ICD-10-CM

## 2018-04-16 DIAGNOSIS — E1159 Type 2 diabetes mellitus with other circulatory complications: Secondary | ICD-10-CM | POA: Diagnosis present

## 2018-04-16 DIAGNOSIS — R0989 Other specified symptoms and signs involving the circulatory and respiratory systems: Secondary | ICD-10-CM

## 2018-04-16 DIAGNOSIS — Z7982 Long term (current) use of aspirin: Secondary | ICD-10-CM

## 2018-04-16 DIAGNOSIS — J9601 Acute respiratory failure with hypoxia: Secondary | ICD-10-CM | POA: Diagnosis not present

## 2018-04-16 DIAGNOSIS — A419 Sepsis, unspecified organism: Secondary | ICD-10-CM | POA: Diagnosis present

## 2018-04-16 DIAGNOSIS — E1169 Type 2 diabetes mellitus with other specified complication: Secondary | ICD-10-CM | POA: Diagnosis present

## 2018-04-16 DIAGNOSIS — Z90710 Acquired absence of both cervix and uterus: Secondary | ICD-10-CM

## 2018-04-16 DIAGNOSIS — K760 Fatty (change of) liver, not elsewhere classified: Secondary | ICD-10-CM | POA: Diagnosis present

## 2018-04-16 DIAGNOSIS — J441 Chronic obstructive pulmonary disease with (acute) exacerbation: Secondary | ICD-10-CM | POA: Diagnosis present

## 2018-04-16 DIAGNOSIS — I10 Essential (primary) hypertension: Secondary | ICD-10-CM

## 2018-04-16 DIAGNOSIS — Z7951 Long term (current) use of inhaled steroids: Secondary | ICD-10-CM

## 2018-04-16 DIAGNOSIS — R6520 Severe sepsis without septic shock: Secondary | ICD-10-CM | POA: Diagnosis present

## 2018-04-16 DIAGNOSIS — R0602 Shortness of breath: Secondary | ICD-10-CM | POA: Diagnosis not present

## 2018-04-16 DIAGNOSIS — Z79899 Other long term (current) drug therapy: Secondary | ICD-10-CM

## 2018-04-16 DIAGNOSIS — E119 Type 2 diabetes mellitus without complications: Secondary | ICD-10-CM

## 2018-04-16 DIAGNOSIS — Z888 Allergy status to other drugs, medicaments and biological substances status: Secondary | ICD-10-CM

## 2018-04-16 DIAGNOSIS — Z720 Tobacco use: Secondary | ICD-10-CM | POA: Diagnosis not present

## 2018-04-16 DIAGNOSIS — A4189 Other specified sepsis: Principal | ICD-10-CM | POA: Diagnosis present

## 2018-04-16 DIAGNOSIS — Z6841 Body Mass Index (BMI) 40.0 and over, adult: Secondary | ICD-10-CM

## 2018-04-16 DIAGNOSIS — Z833 Family history of diabetes mellitus: Secondary | ICD-10-CM

## 2018-04-16 DIAGNOSIS — F17210 Nicotine dependence, cigarettes, uncomplicated: Secondary | ICD-10-CM | POA: Diagnosis present

## 2018-04-16 LAB — CBC WITH DIFFERENTIAL/PLATELET
ABS IMMATURE GRANULOCYTES: 0.05 10*3/uL (ref 0.00–0.07)
Basophils Absolute: 0 10*3/uL (ref 0.0–0.1)
Basophils Relative: 0 %
EOS PCT: 3 %
Eosinophils Absolute: 0.2 10*3/uL (ref 0.0–0.5)
HCT: 43.1 % (ref 36.0–46.0)
Hemoglobin: 13.4 g/dL (ref 12.0–15.0)
Immature Granulocytes: 1 %
Lymphocytes Relative: 20 %
Lymphs Abs: 1.2 10*3/uL (ref 0.7–4.0)
MCH: 30.9 pg (ref 26.0–34.0)
MCHC: 31.1 g/dL (ref 30.0–36.0)
MCV: 99.5 fL (ref 80.0–100.0)
MONO ABS: 0.6 10*3/uL (ref 0.1–1.0)
Monocytes Relative: 10 %
Neutro Abs: 4 10*3/uL (ref 1.7–7.7)
Neutrophils Relative %: 66 %
Platelets: 196 10*3/uL (ref 150–400)
RBC: 4.33 MIL/uL (ref 3.87–5.11)
RDW: 12.6 % (ref 11.5–15.5)
WBC: 6 10*3/uL (ref 4.0–10.5)
nRBC: 0 % (ref 0.0–0.2)

## 2018-04-16 LAB — GLUCOSE, CAPILLARY
Glucose-Capillary: 317 mg/dL — ABNORMAL HIGH (ref 70–99)
Glucose-Capillary: 319 mg/dL — ABNORMAL HIGH (ref 70–99)
Glucose-Capillary: 241 mg/dL — ABNORMAL HIGH (ref 70–99)

## 2018-04-16 LAB — URINALYSIS, ROUTINE W REFLEX MICROSCOPIC
Bilirubin Urine: NEGATIVE
GLUCOSE, UA: NEGATIVE mg/dL
Hgb urine dipstick: NEGATIVE
KETONES UR: 20 mg/dL — AB
Leukocytes, UA: NEGATIVE
Nitrite: NEGATIVE
Protein, ur: 30 mg/dL — AB
Specific Gravity, Urine: 1.021 (ref 1.005–1.030)
pH: 5 (ref 5.0–8.0)

## 2018-04-16 LAB — COMPREHENSIVE METABOLIC PANEL
ALT: 23 U/L (ref 0–44)
AST: 25 U/L (ref 15–41)
Albumin: 4.1 g/dL (ref 3.5–5.0)
Alkaline Phosphatase: 57 U/L (ref 38–126)
Anion gap: 9 (ref 5–15)
BUN: 9 mg/dL (ref 6–20)
CO2: 28 mmol/L (ref 22–32)
Calcium: 8.8 mg/dL — ABNORMAL LOW (ref 8.9–10.3)
Chloride: 102 mmol/L (ref 98–111)
Creatinine, Ser: 0.84 mg/dL (ref 0.44–1.00)
GFR calc Af Amer: >60 mL/min (ref >60)
GFR calc non Af Amer: >60 mL/min (ref >60)
Glucose, Bld: 136 mg/dL — ABNORMAL HIGH (ref 70–99)
Potassium: 3.8 mmol/L (ref 3.5–5.1)
Sodium: 139 mmol/L (ref 135–145)
Total Bilirubin: 0.5 mg/dL (ref 0.3–1.2)
Total Protein: 7.5 g/dL (ref 6.5–8.1)

## 2018-04-16 LAB — I-STAT TROPONIN, ED: Troponin i, poc: 0 ng/mL (ref 0.00–0.08)

## 2018-04-16 LAB — LACTIC ACID, PLASMA: LACTIC ACID, VENOUS: 1 mmol/L (ref 0.5–1.9)

## 2018-04-16 LAB — POC INFLUENZA A&B (BINAX/QUICKVUE)
Influenza A, POC: POSITIVE — AB
Influenza B, POC: NEGATIVE

## 2018-04-16 LAB — TSH: TSH: 0.592 u[IU]/mL (ref 0.350–4.500)

## 2018-04-16 MED ORDER — OSELTAMIVIR PHOSPHATE 75 MG PO CAPS
75.0000 mg | ORAL_CAPSULE | Freq: Once | ORAL | Status: AC
  Administered 2018-04-16: 75 mg via ORAL
  Filled 2018-04-16: qty 1

## 2018-04-16 MED ORDER — ENOXAPARIN SODIUM 40 MG/0.4ML ~~LOC~~ SOLN
40.0000 mg | SUBCUTANEOUS | Status: DC
  Administered 2018-04-16 – 2018-04-17 (×2): 40 mg via SUBCUTANEOUS
  Filled 2018-04-16: qty 0.4

## 2018-04-16 MED ORDER — IPRATROPIUM BROMIDE 0.02 % IN SOLN
0.5000 mg | Freq: Once | RESPIRATORY_TRACT | Status: AC
  Administered 2018-04-16: 0.5 mg via RESPIRATORY_TRACT
  Filled 2018-04-16: qty 2.5

## 2018-04-16 MED ORDER — ONDANSETRON HCL 4 MG/2ML IJ SOLN: 4.0000 mg | Freq: Four times a day (QID) | INTRAMUSCULAR | Status: DC | PRN

## 2018-04-16 MED ORDER — INSULIN DETEMIR 100 UNIT/ML ~~LOC~~ SOLN
15.0000 [IU] | Freq: Every day | SUBCUTANEOUS | Status: DC
  Administered 2018-04-16 – 2018-04-17 (×2): 15 [IU] via SUBCUTANEOUS
  Filled 2018-04-16 (×2): qty 0.15

## 2018-04-16 MED ORDER — MOMETASONE FURO-FORMOTEROL FUM 200-5 MCG/ACT IN AERO
2.0000 | INHALATION_SPRAY | Freq: Two times a day (BID) | RESPIRATORY_TRACT | Status: DC
  Administered 2018-04-16 – 2018-04-18 (×4): 2 via RESPIRATORY_TRACT
  Filled 2018-04-16: qty 8.8

## 2018-04-16 MED ORDER — IPRATROPIUM-ALBUTEROL 0.5-2.5 (3) MG/3ML IN SOLN
3.0000 mL | Freq: Four times a day (QID) | RESPIRATORY_TRACT | Status: AC
  Administered 2018-04-16: 3 mL via RESPIRATORY_TRACT

## 2018-04-16 MED ORDER — SODIUM CHLORIDE 0.9 % IV BOLUS (SEPSIS)
1000.0000 mL | Freq: Once | INTRAVENOUS | Status: AC
  Administered 2018-04-16: 1000 mL via INTRAVENOUS

## 2018-04-16 MED ORDER — PANTOPRAZOLE SODIUM 40 MG PO TBEC
40.0000 mg | DELAYED_RELEASE_TABLET | Freq: Every day | ORAL | Status: DC
  Administered 2018-04-16 – 2018-04-18 (×2): 40 mg via ORAL
  Filled 2018-04-16 (×2): qty 1

## 2018-04-16 MED ORDER — DIPHENHYDRAMINE-APAP (SLEEP) 25-500 MG PO TABS: 1.0000 | ORAL_TABLET | Freq: Every evening | ORAL | Status: DC | PRN

## 2018-04-16 MED ORDER — METHYLPREDNISOLONE SODIUM SUCC 125 MG IJ SOLR
125.0000 mg | Freq: Once | INTRAMUSCULAR | Status: AC
  Administered 2018-04-16: 125 mg via INTRAVENOUS
  Filled 2018-04-16: qty 2

## 2018-04-16 MED ORDER — INSULIN ASPART 100 UNIT/ML ~~LOC~~ SOLN
0.0000 [IU] | Freq: Every day | SUBCUTANEOUS | Status: DC
  Administered 2018-04-16: 4 [IU] via SUBCUTANEOUS
  Administered 2018-04-17: 2 [IU] via SUBCUTANEOUS

## 2018-04-16 MED ORDER — ACETAMINOPHEN 325 MG PO TABS: 650.0000 mg | ORAL_TABLET | Freq: Four times a day (QID) | ORAL | Status: DC | PRN

## 2018-04-16 MED ORDER — SODIUM CHLORIDE 0.9 % IV BOLUS (SEPSIS)
600.0000 mL | Freq: Once | INTRAVENOUS | Status: AC
  Administered 2018-04-16: 600 mL via INTRAVENOUS

## 2018-04-16 MED ORDER — ACETAMINOPHEN 650 MG RE SUPP: 650.0000 mg | Freq: Four times a day (QID) | RECTAL | Status: DC | PRN

## 2018-04-16 MED ORDER — INSULIN ASPART 100 UNIT/ML ~~LOC~~ SOLN
0.0000 [IU] | Freq: Three times a day (TID) | SUBCUTANEOUS | Status: DC
  Administered 2018-04-16: 7 [IU] via SUBCUTANEOUS
  Administered 2018-04-17: 1 [IU] via SUBCUTANEOUS
  Administered 2018-04-17: 7 [IU] via SUBCUTANEOUS
  Administered 2018-04-18: 2 [IU] via SUBCUTANEOUS

## 2018-04-16 MED ORDER — ALBUTEROL (5 MG/ML) CONTINUOUS INHALATION SOLN
15.0000 mg/h | INHALATION_SOLUTION | Freq: Once | RESPIRATORY_TRACT | Status: AC
  Administered 2018-04-16: 15 mg/h via RESPIRATORY_TRACT
  Filled 2018-04-16: qty 20

## 2018-04-16 MED ORDER — NICOTINE 21 MG/24HR TD PT24
21.0000 mg | MEDICATED_PATCH | Freq: Every day | TRANSDERMAL | Status: DC
  Administered 2018-04-16 – 2018-04-18 (×2): 21 mg via TRANSDERMAL
  Filled 2018-04-16 (×2): qty 1

## 2018-04-16 MED ORDER — HYDROCHLOROTHIAZIDE 25 MG PO TABS
12.5000 mg | ORAL_TABLET | Freq: Every day | ORAL | Status: DC
  Administered 2018-04-17 – 2018-04-18 (×2): 12.5 mg via ORAL
  Filled 2018-04-16 (×2): qty 1

## 2018-04-16 MED ORDER — ACETAMINOPHEN 500 MG PO TABS: 500.0000 mg | ORAL_TABLET | Freq: Every evening | ORAL | Status: DC | PRN

## 2018-04-16 MED ORDER — ASPIRIN EC 81 MG PO TBEC
81.0000 mg | DELAYED_RELEASE_TABLET | Freq: Every day | ORAL | Status: DC
  Administered 2018-04-16 – 2018-04-18 (×3): 81 mg via ORAL
  Filled 2018-04-16 (×3): qty 1

## 2018-04-16 MED ORDER — FLUTICASONE PROPIONATE 50 MCG/ACT NA SUSP
1.0000 | Freq: Every day | NASAL | Status: DC
  Administered 2018-04-16 – 2018-04-18 (×3): 1 via NASAL
  Filled 2018-04-16: qty 16

## 2018-04-16 MED ORDER — DIPHENHYDRAMINE HCL 25 MG PO CAPS: 25.0000 mg | ORAL_CAPSULE | Freq: Every evening | ORAL | Status: DC | PRN

## 2018-04-16 MED ORDER — ACETAMINOPHEN 500 MG PO TABS
1000.0000 mg | ORAL_TABLET | Freq: Once | ORAL | Status: AC
  Administered 2018-04-16: 1000 mg via ORAL
  Filled 2018-04-16: qty 2

## 2018-04-16 MED ORDER — ONDANSETRON HCL 4 MG PO TABS: 4.0000 mg | ORAL_TABLET | Freq: Four times a day (QID) | ORAL | Status: DC | PRN

## 2018-04-16 MED ORDER — OSELTAMIVIR PHOSPHATE 75 MG PO CAPS
75.0000 mg | ORAL_CAPSULE | Freq: Two times a day (BID) | ORAL | Status: DC
  Administered 2018-04-16 – 2018-04-18 (×4): 75 mg via ORAL
  Filled 2018-04-16 (×4): qty 1

## 2018-04-16 MED ORDER — SODIUM CHLORIDE 0.9 % IV SOLN
Freq: Once | INTRAVENOUS | Status: AC
  Administered 2018-04-16: 16:00:00 via INTRAVENOUS

## 2018-04-16 MED ORDER — CANAGLIFLOZIN 100 MG PO TABS
100.0000 mg | ORAL_TABLET | Freq: Every day | ORAL | Status: DC
  Administered 2018-04-17 – 2018-04-18 (×2): 100 mg via ORAL
  Filled 2018-04-16 (×2): qty 1

## 2018-04-16 NOTE — ED Triage Notes (Signed)
Per EMS pt sent from PCP related to hypoxia 80s on RA; placed on 2 lpm [place] with EMS. Diagnosed with flu A; complaint of fever and h/a. Hx of asthma/COPD.

## 2018-04-16 NOTE — ED Notes (Signed)
ED TO INPATIENT HANDOFF REPORT  Name/Age/Gender Judy Fischer 58 y.o. female  Code Status   Home/SNF/Other Home  Chief Complaint flu sx  Level of Care/Admitting Diagnosis ED Disposition    ED Disposition Condition Comment   Admit  [facility] [[id number]]  Level of Care: Med-Surg [16]  Diagnosis: Acute respiratory failure with hypoxia [facility](HCC) [[id number]][[id number]]  Admitting Physician: Tyrone NineGRUNZ, RYAN B [phone][[id number]]  Attending Physician: Tyrone NineGRUNZ, RYAN B [phone][[id number]]  PT Class (Do Not Modify): Observation [104]  PT Acc Code (Do Not Modify): Observation [10022]       Medical History Past Medical History:  Diagnosis Date  . Abnormal MRI of abdomen 08/13/2012   Tiny Liver lesions [facility]   . Benign breast cyst in female    Need [facility]   . Bilateral breast cysts    benign, needs yearly mammograms, per [facility]   . Diabetes (HCC)   . Diabetes mellitus without complication (HCC)   . Fatty liver    Noted in records received from [facility]   . GERD (gastroesophageal reflux disease)   . Heart murmur    mild  . Hepatic cyst    bening on MRI 2014, [facility]   . History of blood transfusion 1987  . Hypertension   . Obesity   . Pneumonia   . Psoriasis    [facility]     Allergies Allergies  Allergen Reactions  . Metformin And Related Nausea Only    IV Location/Drains/Wounds Patient Lines/Drains/Airways Status   Active Line/Drains/Airways    Name:   Placement date:   Placement time:   Site:   Days:   Peripheral IV 06/26/12 Right Arm   06/26/12    1101    Arm   2120   Peripheral IV 04/16/18 Right Antecubital   04/16/18    1119    Antecubital   less than 1   Peripheral IV 04/16/18 Left Antecubital   04/16/18    1120    Antecubital   less than 1          Labs/Imaging Results for orders placed or performed during the hospital encounter  of 04/16/18 (from the past 48 hour(s))  Urinalysis, Routine w reflex microscopic     Status: Abnormal   Collection Time: 04/16/18 10:31 AM  Result Value Ref Range   Color, Urine YELLOW YELLOW   APPearance CLEAR CLEAR   Specific Gravity, Urine 1.021 1.005 - 1.030   pH 5.0 5.0 - 8.0   Glucose, UA NEGATIVE NEGATIVE mg/dL   Hgb urine dipstick NEGATIVE NEGATIVE   Bilirubin Urine NEGATIVE NEGATIVE   Ketones, ur 20 (A) NEGATIVE mg/dL   Protein, ur 30 (A) NEGATIVE mg/dL   Nitrite NEGATIVE NEGATIVE   Leukocytes, UA NEGATIVE NEGATIVE   RBC / HPF 0-5 0 - 5 RBC/hpf   WBC, UA 0-5 0 - 5 WBC/hpf   Bacteria, UA RARE (A) NONE SEEN   Squamous Epithelial / LPF 0-5 0 - 5   Mucus PRESENT     Comment: Performed at [facility], [address] [place], [place] [address]  Lactic acid, plasma     Status: None   Collection Time: 04/16/18 11:27 AM  Result Value Ref Range   Lactic Acid, Venous 1.0 0.5 - 1.9 mmol/L    Comment: Performed at [facility], [address] [place], [place] [address]  Comprehensive metabolic panel     Status: Abnormal   Collection Time: 04/16/18 11:27 AM  Result Value Ref Range   Sodium 139 135 - 145 mmol/L   Potassium 3.8 3.5 - 5.1 mmol/L   Chloride 102 98 - 111 mmol/L   CO2 28 22 - 32 mmol/L   Glucose, Bld 136 (H) 70 - 99 mg/dL   BUN 9 6 - 20 mg/dL   Creatinine, Ser 1.94 0.44 - 1.00 mg/dL   Calcium 8.8 (L) 8.9 - 10.3 mg/dL   Total Protein 7.5 6.5 - 8.1 g/dL   Albumin 4.1 3.5 - 5.0 g/dL   AST 25 15 - 41 U/L   ALT 23 0 - 44 U/L   Alkaline Phosphatase 57 38 - 126 U/L   Total Bilirubin 0.5 0.3 - 1.2 mg/dL   GFR calc non Af Amer >60 >60 mL/min   GFR calc Af Amer >60 >60 mL/min   Anion gap 9 5 - 15    Comment: Performed at [facility], [address] [place], [place] [address]  CBC WITH DIFFERENTIAL     Status: None   Collection Time: 04/16/18 11:27 AM  Result Value Ref Range   WBC 6.0 4.0 - 10.5 K/uL   RBC  4.33 3.87 - 5.11 MIL/uL   Hemoglobin 13.4 12.0 - 15.0 g/dL   HCT 14.4 81.8 - 56.3 %   MCV 99.5 80.0 - 100.0 fL   MCH 30.9 26.0 - 34.0 pg   MCHC 31.1 30.0 - 36.0 g/dL   RDW 14.9 70.2 - 63.7 %   Platelets 196 150 - 400 K/uL   nRBC 0.0 0.0 - 0.2 %   Neutrophils Relative % 66 %   Neutro Abs 4.0 1.7 - 7.7 K/uL   Lymphocytes Relative 20 %   Lymphs Abs 1.2 0.7 - 4.0 K/uL   Monocytes Relative 10 %   Monocytes Absolute 0.6 0.1 - 1.0 K/uL   Eosinophils Relative 3 %   Eosinophils Absolute 0.2 0.0 - 0.5 K/uL   Basophils Relative 0 %   Basophils Absolute 0.0 0.0 - 0.1 K/uL   Immature Granulocytes 1 %   Abs Immature Granulocytes 0.05 0.00 - 0.07 K/uL    Comment: Performed at [facility], [address], [place], [place] [address]  I-stat troponin, ED     Status: None   Collection Time: 04/16/18 11:27 AM  Result Value Ref Range   Troponin i, poc 0.00 0.00 - 0.08 ng/mL   Comment 3            Comment: Due to the release kinetics of cTnI, a negative result within the first hours of the onset of symptoms does not rule out myocardial infarction with certainty. If myocardial infarction is still suspected, repeat the test at appropriate intervals.    Dg Chest Port 1 View  Result Date: 04/16/2018 CLINICAL DATA:  Shortness of breath and fever EXAM: PORTABLE CHEST 1 VIEW COMPARISON:  May 12, 2017 FINDINGS: There is no edema or consolidation. Heart size and pulmonary vascularity are normal. No adenopathy. No bone lesions. IMPRESSION: No edema or consolidation. Electronically Signed   By: Bretta Bang III M.D.   On: 04/16/2018 11:55    Pending Labs Unresulted Labs (From admission, onward)    Start     Ordered   04/16/18 1031  Blood Culture (routine x 2)  BLOOD CULTURE X 2,   STAT     04/16/18 1031   04/16/18 1031  Urine culture  ONCE - STAT,   STAT     04/16/18 1031          Vitals/Pain Today's Vitals   04/16/18 1130 04/16/18 1200 04/16/18 1230 04/16/18 1330  BP:  (!) 106/44 118/66 118/70 126/68  Pulse: (!) 104 (!) 123 (!) 136 (!) 125  Resp: (!) 27  (!) 28 (!) 24  Temp:      TempSrc:      SpO2: 100% 99% 98% 94%  Weight:      Height:      PainSc:        Isolation Precautions No active isolations  Medications Medications  sodium chloride 0.9 % bolus 1,000 mL (0 mLs Intravenous Stopped 04/16/18 1332)    And  sodium chloride 0.9 % bolus 600 mL (has no administration in time range)  acetaminophen (TYLENOL) tablet 1,000 mg (1,000 mg Oral Given 04/16/18 1126)  oseltamivir (TAMIFLU) capsule 75 mg (75 mg Oral Given 04/16/18 1126)  albuterol (PROVENTIL,VENTOLIN) solution continuous neb (15 mg/hr Nebulization Given by Other 04/16/18 1122)  ipratropium (ATROVENT) nebulizer solution 0.5 mg (0.5 mg Nebulization Given by Other 04/16/18 1122)  methylPREDNISolone sodium succinate (SOLU-MEDROL) 125 mg/2 mL injection 125 mg (125 mg Intravenous Given 04/16/18 1213)    Mobility walks

## 2018-04-16 NOTE — ED Provider Notes (Signed)
[facility]-EMERGENCY DEPT Provider Note   CSN: [id number] Arrival date & time: 04/16/18  1017     History   Chief Complaint Chief Complaint  Patient presents with  . Hypoxia    HPI Judy Fischer is a 58 y.o. female hx of COPD, hypertension, diabetes here presenting with shortness of breath, fever, chills, hypoxia.  Patient has been having subjective fever and chills for the last 2 days.  Has worsening shortness of breath since this morning.  She went into the PCP office and was noted to be flu A positive as well as hypoxic.  Patient was sent here for hypoxia.  Patient was noted to be hypoxic 84% in triage as well as febrile and tachycardic.  Patient states that she did get the flu shot this year.  The history is provided by the patient.    Past Medical History:  Diagnosis Date  . Abnormal MRI of abdomen 08/13/2012   [facility]   . Benign breast cyst in female    Need yearly mammograms, [facility]   . Bilateral breast cysts    benign, needs yearly mammograms, [facility]   . Diabetes (HCC)   . Diabetes mellitus without complication (HCC)   . Fatty liver    [facility]   . GERD (gastroesophageal reflux disease)   . Heart murmur    mild  . Hepatic cyst    bening on MRI 2014, Per [facility]   . History of blood transfusion 1987  . Hypertension   . Obesity   . Pneumonia   . Psoriasis    [facility]     Patient Active Problem List   Diagnosis Date Noted  . Psoriasis 05/12/2017  . Chronic pain of right knee 03/31/2017  . Swelling of joint, knee, left 03/31/2017  . Type 2 diabetes mellitus without complication, with long-term current use of insulin (HCC) 03/31/2017  . Allergic rhinitis 03/31/2017  . Continuous tobacco abuse 03/31/2017  . Hypertension associated with diabetes (HCC) 03/31/2017    Past  Surgical History:  Procedure Laterality Date  . ABDOMINAL HYSTERECTOMY  2011   ovaries removed  . COLONOSCOPY WITH PROPOFOL N/A 06/26/2012   Procedure: COLONOSCOPY WITH PROPOFOL;  Surgeon: Charolett BumpersMartin K Johnson, MD;  Location: [facility];  Service: Endoscopy;  Laterality: N/A;  . surgery for ectopic pregnancy  1987  . TONSILLECTOMY  as child     OB History   No obstetric history on file.      Home Medications    Prior to Admission medications   Medication Sig Start Date End Date Taking? Authorizing Provider  Albuterol Sulfate (PROAIR RESPICLICK) 108 (90 Base) MCG/ACT AEPB Inhale 1 puff into the lungs every 6 (six) hours as needed. 01/23/18  Yes Nche, Bonna Gainsharlotte Lum, NP  aspirin 81 MG tablet Take 81 mg by mouth daily.   Yes [provider]  diphenhydramine-acetaminophen (TYLENOL PM) 25-500 MG TABS tablet Take 1 tablet by mouth at bedtime as needed (sleep).    Yes [provider]  empagliflozin (JARDIANCE) 10 MG TABS tablet Take 10 mg by mouth at bedtime. 02/21/18  Yes Nche, Bonna Gainsharlotte Lum, NP  fluticasone (FLONASE) 50 MCG/ACT nasal spray Place 1 spray into both nostrils daily. 07/12/17  Yes Montez Moritaarter, Monica, DO  glipiZIDE (GLUCOTROL XL) 2.5 MG 24 hr tablet Take 1 tablet (2.5 mg total) by mouth daily with  breakfast. 01/23/18  Yes Nche, Bonna Gains, NP  hydrochlorothiazide (HYDRODIURIL) 12.5 MG tablet Take 1 tablet (12.5 mg total) by mouth daily. 02/21/18  Yes Nche, Bonna Gains, NP  Insulin Detemir (LEVEMIR FLEXTOUCH) 100 UNIT/ML Pen Inject 30 Units into the skin at bedtime. 12/19/17  Yes Kirt Boys, DO  nystatin-triamcinolone (MYCOLOG II) cream Apply 1 application topically 2 (two) times daily. 01/23/18  Yes Nche, Bonna Gains, NP  omeprazole (PRILOSEC) 20 MG capsule Take 1 capsule (20 mg total) by mouth daily. 01/23/18  Yes Nche, Bonna Gains, NP  SYMBICORT 160-4.5 MCG/ACT inhaler Inhale 2 puffs into the lungs 2 (two) times daily. FOR COPD 11/21/17  Yes Kirt Boys,  DO  trolamine salicylate (ASPERCREME) 10 % cream Apply 1 application topically as needed for muscle pain.   Yes [provider]  Continuous Blood Gluc Receiver (FREESTYLE LIBRE 14 DAY READER) DEVI 1 Units by Does not apply route once a week. 02/21/18   Nche, Bonna Gains, NP  Continuous Blood Gluc Sensor (FREESTYLE LIBRE 14 DAY SENSOR) MISC APPLY 1 SENSOR AS DIRECTED EVERY 14 DAYS, CHECK BLOOD SUGAR TWICE DAILY 03/28/18   Nche, Bonna Gains, NP  Insulin Pen Needle (NOVOFINE) 32G X 6 MM MISC Use once daily with administration of insulin DX E11.59 07/12/17   Kirt Boys, DO    Family History Family History  Problem Relation Age of Onset  . Diabetes Other   . Hypertension Other   . Diabetes Mother   . Hypertension Mother   . Heart disease Mother   . Cancer Father   . Diabetes Brother   . Diabetes Brother     Social History Social History   Tobacco Use  . Smoking status: Current Every Day Smoker    Packs/day: 1.00    Years: 35.00    Pack years: 35.00    Types: Cigarettes  . Smokeless tobacco: Never Used  Substance Use Topics  . Alcohol use: No  . Drug use: No     Allergies   Metformin and related   Review of Systems Review of Systems  Constitutional: Positive for chills and fever.  Respiratory: Positive for cough and shortness of breath.   All other systems reviewed and are negative.    Physical Exam Updated Vital Signs BP 118/70   Pulse (!) 136   Temp 100 F (37.8 C) (Oral)   Resp (!) 28   Ht 5' 2.5" (1.588 m)   Wt 108.4 kg   LMP 03/07/2006 (Approximate)   SpO2 98% Comment: pt on 2L O2 by [place]  BMI 43.02 kg/m   Physical Exam Vitals signs and nursing note reviewed.  Constitutional:      Appearance: Normal appearance.  HENT:     Head: Normocephalic.     Nose: Nose normal.     Mouth/Throat:     Comments: MM slightly dry  Eyes:     Extraocular Movements: Extraocular movements intact.     Pupils: Pupils are equal, round, and reactive to light.   Neck:     Musculoskeletal: Normal range of motion.  Cardiovascular:     Rate and Rhythm: Regular rhythm.     Comments: Tachycardic  Pulmonary:     Comments: Tachypneic, + wheezing, no retractions or crackles  Abdominal:     General: Abdomen is flat.     Palpations: Abdomen is soft.  Musculoskeletal: Normal range of motion.  Skin:    General: Skin is warm.     Capillary Refill: Capillary refill takes less than  2 seconds.  Neurological:     General: No focal deficit present.     Mental Status: She is alert.  Psychiatric:        Mood and Affect: Mood normal.      ED Treatments / Results  Labs (all labs ordered are listed, but only abnormal results are displayed) Labs Reviewed  COMPREHENSIVE METABOLIC PANEL - Abnormal; Notable for the following components:      Result Value   Glucose, Bld 136 (*)    Calcium 8.8 (*)    All other components within normal limits  URINALYSIS, ROUTINE W REFLEX MICROSCOPIC - Abnormal; Notable for the following components:   Ketones, ur 20 (*)    Protein, ur 30 (*)    Bacteria, UA RARE (*)    All other components within normal limits  CULTURE, BLOOD (ROUTINE X 2)  CULTURE, BLOOD (ROUTINE X 2)  URINE CULTURE  LACTIC ACID, PLASMA  CBC WITH DIFFERENTIAL/PLATELET  LACTIC ACID, PLASMA  I-STAT TROPONIN, ED    EKG EKG Interpretation  Date/Time:  Monday April 16 2018 10:40:58 EST Ventricular Rate:  107 PR Interval:    QRS Duration: 93 QT Interval:  341 QTC Calculation: 455 R Axis:   75 Text Interpretation:  Sinus tachycardia Low voltage, extremity and precordial leads No previous ECGs available Confirmed by Richardean Canal [phone]) on 04/16/2018 10:46:37 AM Also confirmed by Richardean Canal [phone]), editor Chester, Tamera Punt ([id number])  on 04/16/2018 12:25:51 PM   Radiology Dg Chest Port 1 View  Result Date: 04/16/2018 CLINICAL DATA:  Shortness of breath and fever EXAM: PORTABLE CHEST 1 VIEW COMPARISON:  May 12, 2017 FINDINGS: There is no edema or  consolidation. Heart size and pulmonary vascularity are normal. No adenopathy. No bone lesions. IMPRESSION: No edema or consolidation. Electronically Signed   By: Bretta Bang III M.D.   On: 04/16/2018 11:55    Procedures Procedures (including critical care time)  CRITICAL CARE Performed by: Richardean Canal   Total critical care time: 30 minutes  Critical care time was exclusive of separately billable procedures and treating other patients.  Critical care was necessary to treat or prevent imminent or life-threatening deterioration.  Critical care was time spent personally by me on the following activities: development of treatment plan with patient and/or surrogate as well as nursing, discussions with consultants, evaluation of patient's response to treatment, examination of patient, obtaining history from patient or surrogate, ordering and performing treatments and interventions, ordering and review of laboratory studies, ordering and review of radiographic studies, pulse oximetry and re-evaluation of patient's condition.   Medications Ordered in ED Medications  sodium chloride 0.9 % bolus 1,000 mL (1,000 mLs Intravenous New Bag/Given 04/16/18 1125)    And  sodium chloride 0.9 % bolus 600 mL (has no administration in time range)  acetaminophen (TYLENOL) tablet 1,000 mg (1,000 mg Oral Given 04/16/18 1126)  oseltamivir (TAMIFLU) capsule 75 mg (75 mg Oral Given 04/16/18 1126)  albuterol (PROVENTIL,VENTOLIN) solution continuous neb (15 mg/hr Nebulization Given by Other 04/16/18 1122)  ipratropium (ATROVENT) nebulizer solution 0.5 mg (0.5 mg Nebulization Given by Other 04/16/18 1122)  methylPREDNISolone sodium succinate (SOLU-MEDROL) 125 mg/2 mL injection 125 mg (125 mg Intravenous Given 04/16/18 1213)     Initial Impression / Assessment and Plan / ED Course  I have reviewed the triage vital signs and the nursing notes.  Pertinent labs & imaging results that were available during my care of  the patient were reviewed by me and considered in  my medical decision making (see chart for details).    Judy Fischer is a 58 y.o. female here with SOB, hypoxia. Likely flu vs pneumonia vs COPD exacerbation. Will do sepsis workup with labs, CXR, lactate, cultures.   12:58 PM Labs showed nl WBC, nl lactate. CXR showed no infiltrate. Still hypoxic and required 2 L [place]. Held abx. Likely flu and COPD exacerbation with hypoxia. Will admit.    Final Clinical Impressions(s) / ED Diagnoses   Final diagnoses:  None    ED Discharge Orders    None       Charlynne Pander, MD 04/16/18 1259

## 2018-04-16 NOTE — Telephone Encounter (Signed)
Pt. Reports she started this past Friday with a cough and sore throat. The sore throat is gone. Used her "breathing treatment" and cough is better. Is having some shortness of breath with minimal exertion. Having "body aches - mainly in my back." Appointment made for this morning. Reason for Disposition . [1] MODERATE longstanding difficulty breathing (e.g., speaks in phrases, SOB even at rest, pulse 100-120) AND [2] SAME as normal  Answer Assessment - Initial Assessment Questions 1. RESPIRATORY STATUS: "Describe your breathing?" (e.g., wheezing, shortness of breath, unable to speak, severe coughing)      Shortness of breath 2. ONSET: "When did this breathing problem begin?"      Friday night 3. PATTERN "Does the difficult breathing come and go, or has it been constant since it started?"      Comes and goes 4. SEVERITY: "How bad is your breathing?" (e.g., mild, moderate, severe)    - MILD: No SOB at rest, mild SOB with walking, speaks normally in sentences, can lay down, no retractions, pulse < 100.    - MODERATE: SOB at rest, SOB with minimal exertion and prefers to sit, cannot lie down flat, speaks in phrases, mild retractions, audible wheezing, pulse 100-120.    - SEVERE: Very SOB at rest, speaks in single words, struggling to breathe, sitting hunched forward, retractions, pulse > 120      Moderate 5. RECURRENT SYMPTOM: "Have you had difficulty breathing before?" If so, ask: "When was the last time?" and "What happened that time?"      Yes 6. CARDIAC HISTORY: "Do you have any history of heart disease?" (e.g., heart attack, angina, bypass surgery, angioplasty)      No 7. LUNG HISTORY: "Do you have any history of lung disease?"  (e.g., pulmonary embolus, asthma, emphysema)     COPD and asthma 8. CAUSE: "What do you think is causing the breathing problem?"      Unsure 9. OTHER SYMPTOMS: "Do you have any other symptoms? (e.g., dizziness, runny nose, cough, chest pain, fever)     Had a sore  throat and runny, but that is better 10. PREGNANCY: "Is there any chance you are pregnant?" "When was your last menstrual period?"       No 11. TRAVEL: "Have you traveled out of the country in the last month?" (e.g., travel history, exposures)       No  Protocols used: BREATHING DIFFICULTY-A-AH

## 2018-04-16 NOTE — ED Notes (Signed)
Pt provided [place] sandwich, cheese, and diet coke.

## 2018-04-16 NOTE — Patient Instructions (Addendum)
She agreed to be transported to ED via EMS due to hypoxia, and acute respiratory distress. Transported to [facility] by ambulance.

## 2018-04-16 NOTE — Progress Notes (Signed)
Subjective:  Patient ID: Judy Fischer, female    DOB: 08/17/1960  Age: 58 y.o. MRN: [id number]  CC: Shortness of Breath (pt is c/o of SOB,headache,bodyache,pain across back. going on 2 days/ useing inhaler,cold med otc and tylenol. )  Shortness of Breath  This is a new problem. The current episode started in the past 7 days. The problem occurs constantly. The problem has been rapidly worsening. Associated symptoms include chest pain, a fever, orthopnea, PND, rhinorrhea, a sore throat, sputum production and wheezing. Pertinent negatives include no abdominal pain. The symptoms are aggravated by lying flat, any activity, URIs and smoke. The patient has no known risk factors for DVT/PE. She has tried OTC cough suppressants for the symptoms. The treatment provided no relief. Her past medical history is significant for COPD.  current everyday tobacco use.  Reviewed past Medical, Social and Family history today.  No facility-administered medications prior to visit.    Outpatient Medications Prior to Visit  Medication Sig Dispense Refill  . Albuterol Sulfate (PROAIR RESPICLICK) 108 (90 Base) MCG/ACT AEPB Inhale 1 puff into the lungs every 6 (six) hours as needed. 1 each 1  . aspirin 81 MG tablet Take 81 mg by mouth daily.    . Continuous Blood Gluc Receiver (FREESTYLE LIBRE 14 DAY READER) DEVI 1 Units by Does not apply route once a week. 1 Device 0  . Continuous Blood Gluc Sensor (FREESTYLE LIBRE 14 DAY SENSOR) MISC APPLY 1 SENSOR AS DIRECTED EVERY 14 DAYS, CHECK BLOOD SUGAR TWICE DAILY 2 each 0  . diphenhydramine-acetaminophen (TYLENOL PM) 25-500 MG TABS tablet Take 1 tablet by mouth at bedtime as needed (sleep).     . empagliflozin (JARDIANCE) 10 MG TABS tablet Take 10 mg by mouth at bedtime. 30 tablet 1  . fluticasone (FLONASE) 50 MCG/ACT nasal spray Place 1 spray into both nostrils daily. 16 g 3  . glipiZIDE (GLUCOTROL XL) 2.5 MG 24 hr tablet Take 1 tablet (2.5 mg total) by mouth daily with  breakfast. 30 tablet 1  . hydrochlorothiazide (HYDRODIURIL) 12.5 MG tablet Take 1 tablet (12.5 mg total) by mouth daily. 30 tablet 1  . Insulin Detemir (LEVEMIR FLEXTOUCH) 100 UNIT/ML Pen Inject 30 Units into the skin at bedtime. 15 mL 5  . Insulin Pen Needle (NOVOFINE) 32G X 6 MM MISC Use once daily with administration of insulin DX E11.59 100 each 11  . nystatin-triamcinolone (MYCOLOG II) cream Apply 1 application topically 2 (two) times daily. 30 g 0  . omeprazole (PRILOSEC) 20 MG capsule Take 1 capsule (20 mg total) by mouth daily. 30 capsule 3  . SYMBICORT 160-4.5 MCG/ACT inhaler Inhale 2 puffs into the lungs 2 (two) times daily. FOR COPD 1 Inhaler 6  . ipratropium-albuterol (DUONEB) 0.5-2.5 (3) MG/3ML SOLN Take 3 mLs by nebulization every 6 (six) hours as needed.      ROS See HPI  Objective:  BP (!) 144/78   Pulse (!) 105   Temp (!) 101.4 F (38.6 C) (Oral)   Ht 5\' 3"  (1.6 m)   Wt 239 lb 9.6 oz (108.7 kg)   LMP 03/07/2006 (Approximate)   SpO2 (!) 84%   BMI 42.44 kg/m   BP Readings from Last 3 Encounters:  04/16/18 118/70  04/16/18 (!) 144/78  03/09/18 118/80    Wt Readings from Last 3 Encounters:  04/16/18 239 lb (108.4 kg)  04/16/18 239 lb 9.6 oz (108.7 kg)  03/09/18 234 lb 9.6 oz (106.4 kg)    Physical Exam Vitals  signs reviewed.  Constitutional:      General: She is in acute distress.  Cardiovascular:     Rate and Rhythm: Normal rate and regular rhythm.  Pulmonary:     Effort: Tachypnea present. No accessory muscle usage.     Breath sounds: No stridor. Examination of the right-upper field reveals wheezing. Examination of the left-upper field reveals wheezing. Examination of the right-lower field reveals decreased breath sounds, wheezing and rales. Examination of the left-lower field reveals decreased breath sounds, wheezing and rales. Decreased breath sounds, wheezing and rales present.     Comments: Decreased expiratory wheezing after nebulizer treatment, but  persistent hypoxia at 82% room air. O2 sat improved to 92% with oxygen via nasal cannula 2L Musculoskeletal:     Right lower leg: No edema.     Left lower leg: No edema.  Neurological:     Mental Status: She is alert and oriented to person, place, and time.  Psychiatric:        Mood and Affect: Mood normal.        Behavior: Behavior normal.    Lab Results  Component Value Date   WBC 6.0 04/16/2018   HGB 13.4 04/16/2018   HCT 43.1 04/16/2018   PLT 196 04/16/2018   GLUCOSE 136 (H) 04/16/2018   CHOL 135 11/06/2017   TRIG 67 11/06/2017   HDL 49 11/06/2017   LDLCALC 73 11/06/2017   ALT 23 04/16/2018   AST 25 04/16/2018   NA 139 04/16/2018   K 3.8 04/16/2018   CL 102 04/16/2018   CREATININE 0.84 04/16/2018   BUN 9 04/16/2018   CO2 28 04/16/2018   TSH 0.81 01/23/2018   INR 1.0 11/07/2017   HGBA1C 11.7 (A) 02/21/2018   MICROALBUR 1.1 03/31/2017    Mm 3d Screen Breast Bilateral  Result Date: 06/12/2017 CLINICAL DATA:  Screening. EXAM: DIGITAL SCREENING BILATERAL MAMMOGRAM WITH TOMO AND CAD COMPARISON:  Previous exam(s). ACR Breast Density Category c: The breast tissue is heterogeneously dense, which may obscure small masses. FINDINGS: There are no findings suspicious for malignancy. Images were processed with CAD. IMPRESSION: No mammographic evidence of malignancy. A result letter of this screening mammogram will be mailed directly to the patient. RECOMMENDATION: Screening mammogram in one year. (Code:SM-B-01Y) BI-RADS CATEGORY  1: Negative. Electronically Signed   By: Britta Mccreedy M.D.   On: 06/12/2017 13:49    Assessment & Plan:   Judy Fischer was seen today for shortness of breath.  Diagnoses and all orders for this visit:  Hypoxia -     ipratropium-albuterol (DUONEB) 0.5-2.5 (3) MG/3ML nebulizer solution 3 mL  Continuous tobacco abuse  Abnormal lung sounds -     ipratropium-albuterol (DUONEB) 0.5-2.5 (3) MG/3ML nebulizer solution 3 mL  Influenza A -     POC Influenza  A&B(BINAX/QUICKVUE)   I have discontinued Judy Fischer's ipratropium-albuterol. I am also having her maintain her aspirin, diphenhydramine-acetaminophen, Insulin Pen Needle, fluticasone, SYMBICORT, Insulin Detemir, Albuterol Sulfate, nystatin-triamcinolone, omeprazole, glipiZIDE, FREESTYLE LIBRE 14 DAY READER, hydrochlorothiazide, empagliflozin, and FREESTYLE LIBRE 14 DAY SENSOR. We administered ipratropium-albuterol. We will continue to administer ipratropium-albuterol.  Meds ordered this encounter  Medications  . ipratropium-albuterol (DUONEB) 0.5-2.5 (3) MG/3ML nebulizer solution 3 mL    Problem List Items Addressed This Visit      Other   Continuous tobacco abuse    Other Visit Diagnoses    Hypoxia    -  Primary   Relevant Medications   ipratropium-albuterol (DUONEB) 0.5-2.5 (3) MG/3ML nebulizer solution 3  mL   Abnormal lung sounds       Relevant Medications   ipratropium-albuterol (DUONEB) 0.5-2.5 (3) MG/3ML nebulizer solution 3 mL   Influenza A       Relevant Orders   POC Influenza A&B(BINAX/QUICKVUE) (Completed)       Follow-up: Return if symptoms worsen or fail to improve.  Alysia Penna, NP

## 2018-04-16 NOTE — H&P (Signed)
History and Physical   Judy Fischer HQP:[id number] DOB: 26-Mar-1960 DOA: 04/16/2018  Referring MD/NP/PA: Dr. Silverio Lay, EDP PCP: Elease Etienne Bonna Gains, NP Outpatient Specialists: None  Patient coming from: PCP's office  Chief Complaint: Shortness of breath  HPI: Judy Fischer is a 58 y.o. female with a history of COPD, IDT2DM, HTN, psoriasis, and GERD who presented to the ED from her PCP's office, sent due to hypoxia and dyspnea. She reports 3 evenings ago feeling mild sore throat followed by chills and poor sleep, developing shortness of breath and wheezing the following day. Home nebulizer therapies helped but overall dyspnea worsened so she sought care at [facility] office 2/10. There she was found to be newly hypoxic and positive for the flu, sent to ED for further evaluation.   ED Course: Hypoxic, tachycardic, tachypneic, and febrile. Started on oxygen. CXR clear, CBC normal.   Review of Systems: Malaise, fatigue, SOB, fever, chills. Did get flu shot this year. Denies chest pain, palpitations, and per HPI. All others reviewed and are negative.   Past Medical History:  Diagnosis Date  . Abnormal MRI of abdomen 08/13/2012   Tiny Liver lesions per [facility] records   . Benign breast cyst in female    Need yearly mammograms, noted from [facility]   . Bilateral breast cysts    benign, needs yearly mammograms, per [facility] Records   . Diabetes (HCC)   . Diabetes mellitus without complication (HCC)   . Fatty liver    Noted in records received from [facility]   . GERD (gastroesophageal reflux disease)   . Heart murmur    mild  . Hepatic cyst    bening on MRI 2014, Per records from [place]   . History of blood transfusion 1987  . Hypertension   . Obesity   . Pneumonia   . Psoriasis    Noted in [facility] received from [facility]    Past Surgical History:  Procedure Laterality Date  . ABDOMINAL HYSTERECTOMY  2011   ovaries removed  . COLONOSCOPY WITH PROPOFOL N/A  06/26/2012   Procedure: COLONOSCOPY WITH PROPOFOL;  Surgeon: Charolett Bumpers, MD;  Location: [facility] ENDOSCOPY;  Service: Endoscopy;  Laterality: N/A;  . surgery for ectopic pregnancy  1987  . TONSILLECTOMY  as child   - Smokes 1ppd, has tried to quit with patches and is motivated to consider alternative efforts. Works in [place] in [employer], lives alone, no sick contacts. No EtOH or other drugs.   reports that she has been smoking cigarettes. She has a 35.00 pack-year smoking history. She has never used smokeless tobacco. She reports that she does not drink alcohol or use drugs. Allergies  Allergen Reactions  . Metformin And Related Nausea Only   Family History  Problem Relation Age of Onset  . Diabetes Other   . Hypertension Other   . Diabetes Mother   . Hypertension Mother   . Heart disease Mother   . Cancer Father   . Diabetes Brother   . Diabetes Brother    - Family history otherwise reviewed and not pertinent.  Prior to Admission medications   Medication Sig Start Date End Date Taking? Authorizing Provider  Albuterol Sulfate (PROAIR RESPICLICK) 108 (90 Base) MCG/ACT AEPB Inhale 1 puff into the lungs every 6 (six) hours as needed. 01/23/18  Yes Nche, Bonna Gains, NP  aspirin 81 MG tablet Take 81 mg by mouth daily.   Yes [provider]  diphenhydramine-acetaminophen (TYLENOL PM) 25-500  MG TABS tablet Take 1 tablet by mouth at bedtime as needed (sleep).    Yes [provider]  empagliflozin (JARDIANCE) 10 MG TABS tablet Take 10 mg by mouth at bedtime. 02/21/18  Yes Nche, Bonna Gainsharlotte Lum, NP  fluticasone (FLONASE) 50 MCG/ACT nasal spray Place 1 spray into both nostrils daily. 07/12/17  Yes Montez Moritaarter, Monica, DO  glipiZIDE (GLUCOTROL XL) 2.5 MG 24 hr tablet Take 1 tablet (2.5 mg total) by mouth daily with breakfast. 01/23/18  Yes Nche, Bonna Gainsharlotte Lum, NP  hydrochlorothiazide (HYDRODIURIL) 12.5 MG tablet Take 1 tablet (12.5 mg total) by mouth daily. 02/21/18  Yes Nche,  Bonna Gainsharlotte Lum, NP  Insulin Detemir (LEVEMIR FLEXTOUCH) 100 UNIT/ML Pen Inject 30 Units into the skin at bedtime. 12/19/17  Yes Kirt Boysarter, Monica, DO  nystatin-triamcinolone (MYCOLOG II) cream Apply 1 application topically 2 (two) times daily. 01/23/18  Yes Nche, Bonna Gainsharlotte Lum, NP  omeprazole (PRILOSEC) 20 MG capsule Take 1 capsule (20 mg total) by mouth daily. 01/23/18  Yes Nche, Bonna Gainsharlotte Lum, NP  SYMBICORT 160-4.5 MCG/ACT inhaler Inhale 2 puffs into the lungs 2 (two) times daily. FOR COPD 11/21/17  Yes Kirt Boysarter, Monica, DO  trolamine salicylate (ASPERCREME) 10 % cream Apply 1 application topically as needed for muscle pain.   Yes [provider]  Continuous Blood Gluc Receiver (FREESTYLE LIBRE 14 DAY READER) DEVI 1 Units by Does not apply route once a week. 02/21/18   Nche, Bonna Gainsharlotte Lum, NP  Continuous Blood Gluc Sensor (FREESTYLE LIBRE 14 DAY SENSOR) MISC APPLY 1 SENSOR AS DIRECTED EVERY 14 DAYS, CHECK BLOOD SUGAR TWICE DAILY 03/28/18   Nche, Bonna Gainsharlotte Lum, NP  Insulin Pen Needle (NOVOFINE) 32G X 6 MM MISC Use once daily with administration of insulin DX E11.59 07/12/17   Kirt Boysarter, Monica, DO    Physical Exam: Vitals:   04/16/18 1130 04/16/18 1200 04/16/18 1230 04/16/18 1330  BP: (!) 106/44 118/66 118/70 126/68  Pulse: (!) 104 (!) 123 (!) 136 (!) 125  Resp: (!) 27  (!) 28 (!) 24  Temp:      TempSrc:      SpO2: 100% 99% 98% 94%  Weight:      Height:       Constitutional: 58 y.o. female in no distress, jittery demeanor Eyes: Lids and conjunctivae normal, PERRL ENMT: Mucous membranes are moist. Posterior pharynx clear of any exudate or lesions. Normal dentition.  Neck: normal, supple, no masses, no thyromegaly Respiratory: Non-labored but tachypneic with sparse end-expiratory wheezing bilaterally. Short winded with longer sentences. Cardiovascular: Regular tachycardia, no murmurs, rubs, or gallops. No carotid bruits. No JVD. No LE edema. 2+ pedal pulses. Abdomen: Normoactive bowel sounds.  No tenderness, non-distended, and no masses palpated. No hepatosplenomegaly. GU: No indwelling catheter Musculoskeletal: No clubbing / cyanosis. No joint deformity upper and lower extremities. Good ROM, no contractures. Normal muscle tone.  Skin: Warm, dry. Psoriatic patches, annular on right lateral back without induration, spreading erythema, or other ulcers. No significant lesions noted.  Neurologic: CN II-XII grossly intact. Gait not assessed. Speech normal. No focal deficits in motor strength or sensation in all extremities. Psychiatric: Alert and oriented x3. Normal judgment and insight. Mood euthymic with congruent affect.   Labs on Admission: I have personally reviewed following labs and imaging studies  CBC: Recent Labs  Lab 04/16/18 1127  WBC 6.0  NEUTROABS 4.0  HGB 13.4  HCT 43.1  MCV 99.5  PLT 196   Basic Metabolic Panel: Recent Labs  Lab 04/16/18 1127  NA 139  K  3.8  CL 102  CO2 28  GLUCOSE 136*  BUN 9  CREATININE 0.84  CALCIUM 8.8*   GFR: Estimated Creatinine Clearance: 87.5 mL/min (by C-G formula based on SCr of 0.84 mg/dL). Liver Function Tests: Recent Labs  Lab 04/16/18 1127  AST 25  ALT 23  ALKPHOS 57  BILITOT 0.5  PROT 7.5  ALBUMIN 4.1   No results for input(s): LIPASE, AMYLASE in the last 168 hours. No results for input(s): AMMONIA in the last 168 hours. Coagulation Profile: No results for input(s): INR, PROTIME in the last 168 hours. Cardiac Enzymes: No results for input(s): CKTOTAL, CKMB, CKMBINDEX, TROPONINI in the last 168 hours. BNP (last 3 results) No results for input(s): PROBNP in the last 8760 hours. HbA1C: No results for input(s): HGBA1C in the last 72 hours. CBG: No results for input(s): GLUCAP in the last 168 hours. Lipid Profile: No results for input(s): CHOL, HDL, LDLCALC, TRIG, CHOLHDL, LDLDIRECT in the last 72 hours. Thyroid Function Tests: No results for input(s): TSH, T4TOTAL, FREET4, T3FREE, THYROIDAB in the last 72  hours. Anemia Panel: No results for input(s): VITAMINB12, FOLATE, FERRITIN, TIBC, IRON, RETICCTPCT in the last 72 hours. Urine analysis:    Component Value Date/Time   COLORURINE YELLOW 04/16/2018 1031   APPEARANCEUR CLEAR 04/16/2018 1031   LABSPEC 1.021 04/16/2018 1031   PHURINE 5.0 04/16/2018 1031   GLUCOSEU NEGATIVE 04/16/2018 1031   HGBUR NEGATIVE 04/16/2018 1031   BILIRUBINUR NEGATIVE 04/16/2018 1031   KETONESUR 20 (A) 04/16/2018 1031   PROTEINUR 30 (A) 04/16/2018 1031   UROBILINOGEN 0.2 07/11/2008 0034   NITRITE NEGATIVE 04/16/2018 1031   LEUKOCYTESUR NEGATIVE 04/16/2018 1031    No results found for this or any previous visit (from the past 240 hour(s)).   Radiological Exams on Admission: Dg Chest Port 1 View  Result Date: 04/16/2018 CLINICAL DATA:  Shortness of breath and fever EXAM: PORTABLE CHEST 1 VIEW COMPARISON:  May 12, 2017 FINDINGS: There is no edema or consolidation. Heart size and pulmonary vascularity are normal. No adenopathy. No bone lesions. IMPRESSION: No edema or consolidation. Electronically Signed   By: Bretta Bang III M.D.   On: 04/16/2018 11:55    EKG: Independently reviewed. Sinus tachycardia without ischemic changes.  Assessment/Plan Active Problems:   Acute respiratory failure with hypoxia (HCC)   Acute hypoxic respiratory failure: Due to influenza causing COPD exacerbation.  - Continue supplemental oxygen to maintain SpO2 >90% - Treat as below.  Sepsis due to influenza: Mild with mixed picture of etiology of SIRS findings, but does still qualify for sepsis. WBC and lactic acid normal, though.  - Remains tachycardic though suspect beta agonist effect, will give 1L NS at 100cc/hr now. - Tamiflu x5 days  COPD exacerbation:  - Continue dulera (formulary for symbicort) - Continue scheduled and prn nebs - Continue IV steroids - Will not start abx at this time  Tobacco use:  - Extensive cessation counseling provided at discharge, will  follow up with PCP - Nicotine patch 21mg  provided  IDT2DM:  - Decrease levemir 30u > 15u qHS and cover with SSI, glucose 136 at admission.  - Continue jardiance and hold glipizide with use of insulin  HTN:  - Hold HCTZ for today.  GERD:  - Continue PPI  DVT prophylaxis: Lovenox  Code Status: Full  Family Communication: None at bedside Disposition Plan: Home once hypoxia resolved Consults called: None  Admission status: Observation. If remains hypoxic after observation period, may need transition to inpatient management.  Tyrone Nineyan B Grunz, MD [facility] [email or web address] Password [email or web address] 04/16/2018, 2:02 PM

## 2018-04-16 NOTE — ED Notes (Signed)
Bed: MK34 Expected date:  Expected time:  Means of arrival:  Comments: EMS flu hypoxia

## 2018-04-16 NOTE — Progress Notes (Signed)
   04/16/18 1424  MEWS Assessment  Is this an acute change? Yes  MEWS guidelines implemented *See Row Information* Yellow  Rapid Response Notification  Name of Rapid Response RN Notified n/a  Provider Notification  Provider Name/Title Dr Jarvis Newcomer  Date Provider Notified 04/16/18  Time Provider Notified 1400  Notification Type Page  Notification Reason Other (Comment) (MEWS score yellow, pt with pulse 115)  Response No new orders  Date of Provider Response 04/16/18  Time of Provider Response 1405

## 2018-04-17 DIAGNOSIS — J441 Chronic obstructive pulmonary disease with (acute) exacerbation: Secondary | ICD-10-CM | POA: Diagnosis not present

## 2018-04-17 DIAGNOSIS — Z79899 Other long term (current) drug therapy: Secondary | ICD-10-CM | POA: Diagnosis not present

## 2018-04-17 DIAGNOSIS — I152 Hypertension secondary to endocrine disorders: Secondary | ICD-10-CM | POA: Diagnosis present

## 2018-04-17 DIAGNOSIS — E1159 Type 2 diabetes mellitus with other circulatory complications: Secondary | ICD-10-CM

## 2018-04-17 DIAGNOSIS — E1169 Type 2 diabetes mellitus with other specified complication: Secondary | ICD-10-CM | POA: Diagnosis present

## 2018-04-17 DIAGNOSIS — Z794 Long term (current) use of insulin: Secondary | ICD-10-CM

## 2018-04-17 DIAGNOSIS — Z8249 Family history of ischemic heart disease and other diseases of the circulatory system: Secondary | ICD-10-CM | POA: Diagnosis not present

## 2018-04-17 DIAGNOSIS — Z90710 Acquired absence of both cervix and uterus: Secondary | ICD-10-CM | POA: Diagnosis not present

## 2018-04-17 DIAGNOSIS — J9601 Acute respiratory failure with hypoxia: Secondary | ICD-10-CM | POA: Diagnosis not present

## 2018-04-17 DIAGNOSIS — K760 Fatty (change of) liver, not elsewhere classified: Secondary | ICD-10-CM | POA: Diagnosis present

## 2018-04-17 DIAGNOSIS — Z7982 Long term (current) use of aspirin: Secondary | ICD-10-CM | POA: Diagnosis not present

## 2018-04-17 DIAGNOSIS — Z888 Allergy status to other drugs, medicaments and biological substances status: Secondary | ICD-10-CM | POA: Diagnosis not present

## 2018-04-17 DIAGNOSIS — K219 Gastro-esophageal reflux disease without esophagitis: Secondary | ICD-10-CM

## 2018-04-17 DIAGNOSIS — Z6841 Body Mass Index (BMI) 40.0 and over, adult: Secondary | ICD-10-CM | POA: Diagnosis not present

## 2018-04-17 DIAGNOSIS — Z720 Tobacco use: Secondary | ICD-10-CM | POA: Diagnosis not present

## 2018-04-17 DIAGNOSIS — R6520 Severe sepsis without septic shock: Secondary | ICD-10-CM | POA: Diagnosis present

## 2018-04-17 DIAGNOSIS — Z7951 Long term (current) use of inhaled steroids: Secondary | ICD-10-CM | POA: Diagnosis not present

## 2018-04-17 DIAGNOSIS — J101 Influenza due to other identified influenza virus with other respiratory manifestations: Secondary | ICD-10-CM | POA: Diagnosis not present

## 2018-04-17 DIAGNOSIS — E119 Type 2 diabetes mellitus without complications: Secondary | ICD-10-CM

## 2018-04-17 DIAGNOSIS — I10 Essential (primary) hypertension: Secondary | ICD-10-CM

## 2018-04-17 DIAGNOSIS — R0602 Shortness of breath: Secondary | ICD-10-CM | POA: Diagnosis present

## 2018-04-17 DIAGNOSIS — A4189 Other specified sepsis: Secondary | ICD-10-CM | POA: Diagnosis present

## 2018-04-17 DIAGNOSIS — Z833 Family history of diabetes mellitus: Secondary | ICD-10-CM | POA: Diagnosis not present

## 2018-04-17 DIAGNOSIS — F17210 Nicotine dependence, cigarettes, uncomplicated: Secondary | ICD-10-CM | POA: Diagnosis present

## 2018-04-17 LAB — URINE CULTURE: Culture: NO GROWTH

## 2018-04-17 LAB — GLUCOSE, CAPILLARY
Glucose-Capillary: 140 mg/dL — ABNORMAL HIGH (ref 70–99)
Glucose-Capillary: 319 mg/dL — ABNORMAL HIGH (ref 70–99)
Glucose-Capillary: 212 mg/dL — ABNORMAL HIGH (ref 70–99)

## 2018-04-17 LAB — HIV ANTIBODY (ROUTINE TESTING W REFLEX): HIV Screen 4th Generation wRfx: NONREACTIVE

## 2018-04-17 MED ORDER — ALBUTEROL SULFATE (2.5 MG/3ML) 0.083% IN NEBU
2.5000 mg | INHALATION_SOLUTION | RESPIRATORY_TRACT | Status: DC | PRN
  Administered 2018-04-17: 2.5 mg via RESPIRATORY_TRACT

## 2018-04-17 MED ORDER — METHYLPREDNISOLONE SODIUM SUCC 40 MG IJ SOLR
40.0000 mg | Freq: Three times a day (TID) | INTRAMUSCULAR | Status: DC
  Administered 2018-04-17 – 2018-04-18 (×3): 40 mg via INTRAVENOUS
  Filled 2018-04-17 (×3): qty 1

## 2018-04-17 MED ORDER — ALBUTEROL SULFATE (2.5 MG/3ML) 0.083% IN NEBU
INHALATION_SOLUTION | RESPIRATORY_TRACT | Status: AC
  Administered 2018-04-17: 2.5 mg via RESPIRATORY_TRACT
  Filled 2018-04-17: qty 3

## 2018-04-17 MED ORDER — METHYLPREDNISOLONE SODIUM SUCC 125 MG IJ SOLR
60.0000 mg | Freq: Two times a day (BID) | INTRAMUSCULAR | Status: DC
  Administered 2018-04-17: 60 mg via INTRAVENOUS
  Filled 2018-04-17: qty 2

## 2018-04-17 NOTE — Progress Notes (Signed)
PROGRESS NOTE  Judy Fischer  BSJ:[id number] DOB: 04-Apr-1960 DOA: 04/16/2018 PCP: Anne Ng, NP   Brief Narrative: Judy Fischer is a 58 y.o. female with a history of COPD, IDT2DM, HTN, psoriasis, and GERD who presented to the ED from her PCP's office, sent due to hypoxia and dyspnea, found to be flu positive. She's been admitted for COPD exacerbation due to the flu with some improvement but continues to be newly hypoxic.  Assessment & Plan: Principal Problem:   Acute respiratory failure with hypoxia (HCC) Active Problems:   Type 2 diabetes mellitus without complication, with long-term current use of insulin (HCC)   Continuous tobacco abuse   Hypertension associated with diabetes (HCC)   COPD with acute exacerbation (HCC)   Influenza A   Sepsis (HCC)   GERD (gastroesophageal reflux disease)  Acute hypoxic respiratory failure: Due to influenza causing COPD exacerbation.  - Continue supplemental oxygen to maintain SpO2 >90% - Treat as below.  Sepsis due to influenza: Mild with mixed picture of etiology of SIRS findings, but did qualify for sepsis on admission, which has since resolved - Tamiflu x5 days - Monitor blood cultures  COPD exacerbation:  - Continue dulera (formulary for symbicort) - Continue scheduled and prn nebs - Continue IV steroids, augmented today  Tobacco use:  - Extensive cessation counseling provided, will follow up with PCP - Nicotine patch 21mg  provided  IDT2DM:  - Continue levemir qHS and cover with SSI, glucose 136 at admission and 140 this AM. Watch for steroid-induced hyperglycemia.  - Continue jardiance and hold glipizide with use of insulin  HTN:  - Continue HCTZ  GERD:  - Continue PPI  Obesity: BMI 43.  - Dietitian consulted  DVT prophylaxis: Lovenox Code Status: Full Family Communication: None at bedside Disposition Plan: Home once hypoxia resolved. Unfortunately she remains hypoxic with significant dyspnea worse than  baseline today. Will change to inpatient and continue IV steroids.  Consultants:   None  Procedures:   None  Antimicrobials:  Tamiflu  Subjective: Feels better than admission but got very short of breath with ambulation and SpO2 dropped into 80%'s this morning. Does report ongoing wheezing.  Objective: Vitals:   04/17/18 0605 04/17/18 1008 04/17/18 1013 04/17/18 1323  BP: 125/81   129/82  Pulse: 73 80 95 82  Resp: 20   16  Temp: 98.2 F (36.8 C)   98.2 F (36.8 C)  TempSrc: Oral   Oral  SpO2: 93% 91% 90% 90%  Weight:      Height:        Intake/Output Summary (Last 24 hours) at 04/17/2018 1548 Last data filed at 04/17/2018 0900 Gross per 24 hour  Intake 2304.26 ml  Output -  Net 2304.26 ml   Filed Weights   04/16/18 1121  Weight: 108.4 kg    Gen: 58 y.o. female in no distress  Pulm: Non-labored tachypnea with some pursed lip breathing, end-expiratory wheezing and rhonchi.  CV: Regular rate and rhythm. No murmur, rub, or gallop. No JVD, no pedal edema. GI: Abdomen soft, non-tender, non-distended, with normoactive bowel sounds. No organomegaly or masses felt. Ext: Warm, no deformities Skin: No rashes, lesions no ulcers Neuro: Alert and oriented. No focal neurological deficits. Psych: Judgement and insight appear normal. Mood & affect appropriate.   Data Reviewed: I have personally reviewed following labs and imaging studies  CBC: Recent Labs  Lab 04/16/18 1127  WBC 6.0  NEUTROABS 4.0  HGB 13.4  HCT 43.1  MCV 99.5  PLT 196   Basic Metabolic Panel: Recent Labs  Lab 04/16/18 1127  NA 139  K 3.8  CL 102  CO2 28  GLUCOSE 136*  BUN 9  CREATININE 0.84  CALCIUM 8.8*   GFR: Estimated Creatinine Clearance: 87.5 mL/min (by C-G formula based on SCr of 0.84 mg/dL). Liver Function Tests: Recent Labs  Lab 04/16/18 1127  AST 25  ALT 23  ALKPHOS 57  BILITOT 0.5  PROT 7.5  ALBUMIN 4.1   No results for input(s): LIPASE, AMYLASE in the last 168  hours. No results for input(s): AMMONIA in the last 168 hours. Coagulation Profile: No results for input(s): INR, PROTIME in the last 168 hours. Cardiac Enzymes: No results for input(s): CKTOTAL, CKMB, CKMBINDEX, TROPONINI in the last 168 hours. BNP (last 3 results) No results for input(s): PROBNP in the last 8760 hours. HbA1C: No results for input(s): HGBA1C in the last 72 hours. CBG: Recent Labs  Lab 04/16/18 1730 04/16/18 2041 04/16/18 2155 04/17/18 0740  GLUCAP 317* 319* 241* 140*   Lipid Profile: No results for input(s): CHOL, HDL, LDLCALC, TRIG, CHOLHDL, LDLDIRECT in the last 72 hours. Thyroid Function Tests: Recent Labs    04/16/18 1440  TSH 0.592   Anemia Panel: No results for input(s): VITAMINB12, FOLATE, FERRITIN, TIBC, IRON, RETICCTPCT in the last 72 hours. Urine analysis:    Component Value Date/Time   COLORURINE YELLOW 04/16/2018 1031   APPEARANCEUR CLEAR 04/16/2018 1031   LABSPEC 1.021 04/16/2018 1031   PHURINE 5.0 04/16/2018 1031   GLUCOSEU NEGATIVE 04/16/2018 1031   HGBUR NEGATIVE 04/16/2018 1031   BILIRUBINUR NEGATIVE 04/16/2018 1031   KETONESUR 20 (A) 04/16/2018 1031   PROTEINUR 30 (A) 04/16/2018 1031   UROBILINOGEN 0.2 07/11/2008 0034   NITRITE NEGATIVE 04/16/2018 1031   LEUKOCYTESUR NEGATIVE 04/16/2018 1031   Recent Results (from the past 240 hour(s))  Blood Culture (routine x 2)     Status: None (Preliminary result)   Collection Time: 04/16/18 10:36 AM  Result Value Ref Range Status   Specimen Description   Final    RIGHT ANTECUBITAL Performed at [facility], [address] [place], [place] [address]    Special Requests   Final    BOTTLES DRAWN AEROBIC AND ANAEROBIC Blood Culture adequate volume Performed at [facility], [address] [place], [place] [address]    Culture   Final    NO GROWTH < 24 HOURS Performed at [facility], [address] [place], [place] [address]    Report  Status PENDING  Incomplete  Urine culture     Status: None   Collection Time: 04/16/18 10:54 AM  Result Value Ref Range Status   Specimen Description   Final    URINE, RANDOM Performed at [facility], [address] [place], [place] [address]    Special Requests   Final    NONE Performed at [facility], [address] [place], [place] [address]    Culture   Final    NO GROWTH Performed at [facility], [address] [place], [place] [address]    Report Status 04/17/2018 FINAL  Final  Blood Culture (routine x 2)     Status: None (Preliminary result)   Collection Time: 04/16/18 11:26 AM  Result Value Ref Range Status   Specimen Description   Final    LEFT ANTECUBITAL Performed at [facility], [address] [place], [place] [address]  Special Requests   Final    BOTTLES DRAWN AEROBIC AND ANAEROBIC Blood Culture adequate volume Performed at [facility], [address] [place], [place] [address]    Culture   Final    NO GROWTH < 24 HOURS Performed at [facility], [address] [place], [place] [address]    Report Status PENDING  Incomplete      Radiology Studies: Dg Chest Port 1 View  Result Date: 04/16/2018 CLINICAL DATA:  Shortness of breath and fever EXAM: PORTABLE CHEST 1 VIEW COMPARISON:  May 12, 2017 FINDINGS: There is no edema or consolidation. Heart size and pulmonary vascularity are normal. No adenopathy. No bone lesions. IMPRESSION: No edema or consolidation. Electronically Signed   By: Bretta BangWilliam  Woodruff III M.D.   On: 04/16/2018 11:55    Scheduled Meds: . aspirin EC  81 mg Oral Daily  . canagliflozin  100 mg Oral QAC breakfast  . enoxaparin (LOVENOX) injection  40 mg Subcutaneous Q24H  . fluticasone  1 spray Each Nare Daily  . hydrochlorothiazide  12.5 mg Oral Daily  . insulin aspart  0-5 Units Subcutaneous QHS  . insulin aspart  0-9 Units Subcutaneous TID WC    . insulin detemir  15 Units Subcutaneous QHS  . methylPREDNISolone (SOLU-MEDROL) injection  40 mg Intravenous Q8H  . mometasone-formoterol  2 puff Inhalation BID  . nicotine  21 mg Transdermal Daily  . oseltamivir  75 mg Oral BID  . pantoprazole  40 mg Oral Daily   Continuous Infusions:   LOS: 0 days   Time spent: 25 minutes.  Tyrone Nineyan B Gesenia Bantz, MD [facility] [email or web address] Password [email or web address] 04/17/2018, 3:48 PM

## 2018-04-17 NOTE — Progress Notes (Addendum)
Nutrition Brief Note  RD consulted via COPD protocol.  Patient's weight has been stable. Currently consuming 100% of meals.   Wt Readings from Last 15 Encounters:  04/16/18 108.4 kg  04/16/18 108.7 kg  03/09/18 106.4 kg  02/21/18 107 kg  01/23/18 105.7 kg  11/21/17 106.2 kg  10/18/17 110.2 kg  07/12/17 109.8 kg  05/12/17 111.6 kg  03/31/17 113.9 kg  08/06/13 113.8 kg  06/26/12 118.8 kg    Body mass index is 43.02 kg/m. Patient meets criteria for morbid obesity based on current BMI.   Current diet order is Heart Healthy/CHO modified, patient is consuming approximately 100% of meals at this time. Labs and medications reviewed.   No nutrition interventions warranted at this time. If nutrition issues arise, please consult RD.   Tilda Franco, MS, RD, LDN [facility] Inpatient Clinical Dietitian Pager: [phone] After Hours Pager: [phone]

## 2018-04-18 LAB — COMPREHENSIVE METABOLIC PANEL
ALT: 21 U/L (ref 0–44)
AST: 17 U/L (ref 15–41)
Albumin: 3.9 g/dL (ref 3.5–5.0)
Alkaline Phosphatase: 50 U/L (ref 38–126)
Anion gap: 12 (ref 5–15)
BUN: 21 mg/dL — ABNORMAL HIGH (ref 6–20)
CO2: 25 mmol/L (ref 22–32)
Calcium: 9.4 mg/dL (ref 8.9–10.3)
Chloride: 103 mmol/L (ref 98–111)
Creatinine, Ser: 0.78 mg/dL (ref 0.44–1.00)
GFR calc Af Amer: >60 mL/min (ref >60)
GFR calc non Af Amer: >60 mL/min (ref >60)
Glucose, Bld: 165 mg/dL — ABNORMAL HIGH (ref 70–99)
Potassium: 4.1 mmol/L (ref 3.5–5.1)
Sodium: 140 mmol/L (ref 135–145)
Total Bilirubin: 0.7 mg/dL (ref 0.3–1.2)
Total Protein: 7.4 g/dL (ref 6.5–8.1)

## 2018-04-18 LAB — GLUCOSE, CAPILLARY
Glucose-Capillary: 184 mg/dL — ABNORMAL HIGH (ref 70–99)
Glucose-Capillary: 167 mg/dL — ABNORMAL HIGH (ref 70–99)

## 2018-04-18 LAB — CBC
HCT: 42.1 % (ref 36.0–46.0)
Hemoglobin: 13.2 g/dL (ref 12.0–15.0)
MCH: 30.6 pg (ref 26.0–34.0)
MCHC: 31.4 g/dL (ref 30.0–36.0)
MCV: 97.7 fL (ref 80.0–100.0)
NRBC: 0 % (ref 0.0–0.2)
Platelets: 230 10*3/uL (ref 150–400)
RBC: 4.31 MIL/uL (ref 3.87–5.11)
RDW: 12.3 % (ref 11.5–15.5)
WBC: 9.4 10*3/uL (ref 4.0–10.5)

## 2018-04-18 MED ORDER — OSELTAMIVIR PHOSPHATE 75 MG PO CAPS: 75.0000 mg | ORAL_CAPSULE | Freq: Two times a day (BID) | ORAL | 0 refills | Status: DC

## 2018-04-18 MED ORDER — PREDNISONE 10 MG PO TABS: ORAL_TABLET | ORAL | 0 refills | Status: DC

## 2018-04-18 NOTE — Discharge Summary (Signed)
Physician Discharge Summary  Judy Fischer ZOX:[id number] DOB: [birth year] DOA: 04/16/2018  PCP: Anne Ng, NP  Admit date: 04/16/2018 Discharge date: 04/18/2018  Admitted From: Home Disposition: Home  Recommendations for Outpatient Follow-up:  1. Follow up with PCP in 1-2 weeks 2. Please obtain BMP/CBC in one week   Home Health none Equipment/Devices: None  Discharge Condition stable CODE STATUS full code Diet recommendation: Cardiac   brief/Interim Summary:58 y.o.femalewith a history ofCOPD, IDT2DM, HTN, psoriasis, and GERD who presented to the ED from her PCP's office, sent due to hypoxia and dyspnea, found to be flu positive. She's been admitted for COPD exacerbation due to the flu with some improvement but continues to be newly hypoxic  Discharge Diagnoses:  Principal Problem:   Acute respiratory failure with hypoxia (HCC) Active Problems:   Type 2 diabetes mellitus without complication, with long-term current use of insulin (HCC)   Continuous tobacco abuse   Hypertension associated with diabetes (HCC)   COPD with acute exacerbation (HCC)   Influenza A   Sepsis (HCC)   GERD (gastroesophageal reflux disease)  Acute hypoxic respiratory failure: Due to influenza causing COPD exacerbation-patient was treated with IV steroids nebulizers and oxygen.  On the day of discharge she was on room air saturation above 92%.  I will discharge her on p.o. prednisone taper.  Follow-up with PCP.  Sepsis due to influenza: Mild with mixed picture of etiology of SIRS findings, but did qualify for sepsis on admission, which has since resolved - Tamiflu x5 days  COPD exacerbation:  - Continue Symbicort, p.o. steroids  Tobacco use:  - Extensive cessation counseling provided, will follow up with PCP - Nicotine patch 21mg  provided  IDT2DM:  - Continue levemir qHS and cover with SSI, glucose 136 at admission and 140 this AM. Watch for steroid-induced hyperglycemia.  - Continue  jardiance and glipizide.    HTN:  - Continue HCTZ  Estimated body mass index is 43.02 kg/m as calculated from the following:   Height as of this encounter: 5' 2.5" (1.588 m).   Weight as of this encounter: 108.4 kg.  Discharge Instructions  Discharge Instructions    Call MD for:  difficulty breathing, headache or visual disturbances   Complete by:  As directed    Call MD for:  temperature >100.4   Complete by:  As directed    Diet - low sodium heart healthy   Complete by:  As directed    Diet - low sodium heart healthy   Complete by:  As directed    Discharge instructions   Complete by:  As directed    You were treated for the flu which caused a COPD exacerbation and a new oxygen requirement. You are stable enough for discharge with the following recommendations:  - Continue taking tamiflu until you complete the prescribed course to treat the flu - Continue taking prednisone until you complete the course to treat COPD exacerbation - Continue home breathing treatments as you were - Follow up with your PCP in the next week, or seek medical attention sooner if your symptoms worsen.   Increase activity slowly   Complete by:  As directed    Increase activity slowly   Complete by:  As directed      Allergies as of 04/18/2018      Reactions   Metformin And Related Nausea Only      Medication List    TAKE these medications   Albuterol Sulfate 108 (90 Base) MCG/ACT Aepb Commonly  known as:  PROAIR RESPICLICK Inhale 1 puff into the lungs every 6 (six) hours as needed.   aspirin 81 MG tablet Take 81 mg by mouth daily.   diphenhydramine-acetaminophen 25-500 MG Tabs tablet Commonly known as:  TYLENOL PM Take 1 tablet by mouth at bedtime as needed (sleep).   empagliflozin 10 MG Tabs tablet Commonly known as:  JARDIANCE Take 10 mg by mouth at bedtime.   fluticasone 50 MCG/ACT nasal spray Commonly known as:  FLONASE Place 1 spray into both nostrils daily.   FREESTYLE LIBRE  14 DAY READER Devi 1 Units by Does not apply route once a week.   FREESTYLE LIBRE 14 DAY SENSOR Misc APPLY 1 SENSOR AS DIRECTED EVERY 14 DAYS, CHECK BLOOD SUGAR TWICE DAILY   glipiZIDE 2.5 MG 24 hr tablet Commonly known as:  GLUCOTROL XL Take 1 tablet (2.5 mg total) by mouth daily with breakfast.   hydrochlorothiazide 12.5 MG tablet Commonly known as:  HYDRODIURIL Take 1 tablet (12.5 mg total) by mouth daily.   Insulin Detemir 100 UNIT/ML Pen Commonly known as:  LEVEMIR FLEXTOUCH Inject 30 Units into the skin at bedtime.   Insulin Pen Needle 32G X 6 MM Misc Commonly known as:  NOVOFINE Use once daily with administration of insulin DX E11.59   nystatin-triamcinolone cream Commonly known as:  MYCOLOG II Apply 1 application topically 2 (two) times daily.   omeprazole 20 MG capsule Commonly known as:  PRILOSEC Take 1 capsule (20 mg total) by mouth daily.   oseltamivir 75 MG capsule Commonly known as:  TAMIFLU Take 1 capsule (75 mg total) by mouth 2 (two) times daily.   predniSONE 10 MG tablet Commonly known as:  DELTASONE Take 40 mg that is 4 tablets for the first 3 days then 30 mg that is 3 tablets for the next 3 days then 20 mg for the following 3 days and then 10 mg daily till done.   SYMBICORT 160-4.5 MCG/ACT inhaler Generic drug:  budesonide-formoterol Inhale 2 puffs into the lungs 2 (two) times daily. FOR COPD   trolamine salicylate 10 % cream Commonly known as:  ASPERCREME Apply 1 application topically as needed for muscle pain.      Follow-up Information    Nche, Bonna Gains, NP. Schedule an appointment as soon as possible for a visit in 1 week(s).   Specialty:  Internal Medicine Contact information: [address] [place] [address] [phone]          Allergies  Allergen Reactions  . Metformin And Related Nausea Only    Consultations:  None   Procedures/Studies: Dg Chest Port 1 View  Result Date: 04/16/2018 CLINICAL DATA:   Shortness of breath and fever EXAM: PORTABLE CHEST 1 VIEW COMPARISON:  May 12, 2017 FINDINGS: There is no edema or consolidation. Heart size and pulmonary vascularity are normal. No adenopathy. No bone lesions. IMPRESSION: No edema or consolidation. Electronically Signed   By: Bretta Bang III M.D.   On: 04/16/2018 11:55    (Echo, Carotid, EGD, Colonoscopy, ERCP)    Subjective: Patient resting in bed talking on the phone on room air denies any shortness of breath chest pain cough Discharge Exam: Vitals:   04/18/18 0652 04/18/18 0752  BP:    Pulse:    Resp:    Temp:    SpO2: 92% 91%   Vitals:   04/17/18 2200 04/18/18 0500 04/18/18 0652 04/18/18 0752  BP: 122/83 (!) 125/92    Pulse: 82 66  Resp: 16 16    Temp: 98 F (36.7 C) 98 F (36.7 C)    TempSrc: Oral Oral    SpO2: 93% 94% 92% 91%  Weight:      Height:        General: Pt is alert, awake, not in acute distress Cardiovascular: RRR, S1/S2 +, no rubs, no gallops Respiratory: Few wheezes bilaterally, no wheezing, no rhonchi Abdominal: Soft, NT, ND, bowel sounds + Extremities: no edema, no cyanosis    The results of significant diagnostics from this hospitalization (including imaging, microbiology, ancillary and laboratory) are listed below for reference.     Microbiology: Recent Results (from the past 240 hour(s))  Blood Culture (routine x 2)     Status: None (Preliminary result)   Collection Time: 04/16/18 10:36 AM  Result Value Ref Range Status   Specimen Description   Final    RIGHT ANTECUBITAL Performed at [facility], [address] [place], [place] [address]    Special Requests   Final    BOTTLES DRAWN AEROBIC AND ANAEROBIC Blood Culture adequate volume Performed at [facility], [address] [place], [place] [address]    Culture   Final    NO GROWTH 2 DAYS Performed at [facility], [address] [place], [place] [address]    Report Status  PENDING  Incomplete  Urine culture     Status: None   Collection Time: 04/16/18 10:54 AM  Result Value Ref Range Status   Specimen Description   Final    URINE, RANDOM Performed at [facility], [address] [place], [place] [address]    Special Requests   Final    NONE Performed at [facility], [address] [place], [place] [address]    Culture   Final    NO GROWTH Performed at [facility], [address] [place], [place] [address]    Report Status 04/17/2018 FINAL  Final  Blood Culture (routine x 2)     Status: None (Preliminary result)   Collection Time: 04/16/18 11:26 AM  Result Value Ref Range Status   Specimen Description   Final    LEFT ANTECUBITAL Performed at [facility], [address] [place], [place] [address]    Special Requests   Final    BOTTLES DRAWN AEROBIC AND ANAEROBIC Blood Culture adequate volume Performed at [facility], [address] [place], [place] [address]    Culture   Final    NO GROWTH 2 DAYS Performed at [facility], [address] [place], [place] [address]    Report Status PENDING  Incomplete     Labs: BNP (last 3 results) No results for input(s): BNP in the last 8760 hours. Basic Metabolic Panel: Recent Labs  Lab 04/16/18 1127 04/18/18 0658  NA 139 140  K 3.8 4.1  CL 102 103  CO2 28 25  GLUCOSE 136* 165*  BUN 9 21*  CREATININE 0.84 0.78  CALCIUM 8.8* 9.4   Liver Function Tests: Recent Labs  Lab 04/16/18 1127 04/18/18 0658  AST 25 17  ALT 23 21  ALKPHOS 57 50  BILITOT 0.5 0.7  PROT 7.5 7.4  ALBUMIN 4.1 3.9   No results for input(s): LIPASE, AMYLASE in the last 168 hours. No results for input(s): AMMONIA in the last 168 hours. CBC: Recent Labs  Lab 04/16/18 1127 04/18/18 0658  WBC 6.0 9.4  NEUTROABS 4.0  --  HGB 13.4 13.2  HCT 43.1 42.1  MCV 99.5 97.7  PLT 196 230   Cardiac Enzymes: No results for  input(s): CKTOTAL, CKMB, CKMBINDEX, TROPONINI in the last 168 hours. BNP: Invalid input(s): POCBNP CBG: Recent Labs  Lab 04/16/18 2155 04/17/18 0740 04/17/18 1748 04/17/18 2208 04/18/18 0734  GLUCAP 241* 140* 319* 212* 184*   D-Dimer No results for input(s): DDIMER in the last 72 hours. Hgb A1c No results for input(s): HGBA1C in the last 72 hours. Lipid Profile No results for input(s): CHOL, HDL, LDLCALC, TRIG, CHOLHDL, LDLDIRECT in the last 72 hours. Thyroid function studies Recent Labs    04/16/18 1440  TSH 0.592   Anemia work up No results for input(s): VITAMINB12, FOLATE, FERRITIN, TIBC, IRON, RETICCTPCT in the last 72 hours. Urinalysis    Component Value Date/Time   COLORURINE YELLOW 04/16/2018 1031   APPEARANCEUR CLEAR 04/16/2018 1031   LABSPEC 1.021 04/16/2018 1031   PHURINE 5.0 04/16/2018 1031   GLUCOSEU NEGATIVE 04/16/2018 1031   HGBUR NEGATIVE 04/16/2018 1031   BILIRUBINUR NEGATIVE 04/16/2018 1031   KETONESUR 20 (A) 04/16/2018 1031   PROTEINUR 30 (A) 04/16/2018 1031   UROBILINOGEN 0.2 07/11/2008 0034   NITRITE NEGATIVE 04/16/2018 1031   LEUKOCYTESUR NEGATIVE 04/16/2018 1031   Sepsis Labs Invalid input(s): PROCALCITONIN,  WBC,  LACTICIDVEN Microbiology Recent Results (from the past 240 hour(s))  Blood Culture (routine x 2)     Status: None (Preliminary result)   Collection Time: 04/16/18 10:36 AM  Result Value Ref Range Status   Specimen Description   Final    RIGHT ANTECUBITAL Performed at [facility], [address] [place], [place] [address]    Special Requests   Final    BOTTLES DRAWN AEROBIC AND ANAEROBIC Blood Culture adequate volume Performed at [facility], [address] [place], [place] [address]    Culture   Final    NO GROWTH 2 DAYS Performed at [facility], [address], [place], [place] [address]    Report Status PENDING  Incomplete  Urine culture     Status: None   Collection  Time: 04/16/18 10:54 AM  Result Value Ref Range Status   Specimen Description   Final    URINE, RANDOM Performed at [facility], [address], [place], [place] [address]    Special Requests   Final    NONE Performed at [facility], [address] [place], [place] [address]    Culture   Final    NO GROWTH Performed at [facility], [address] [place], [place] [address]    Report Status 04/17/2018 FINAL  Final  Blood Culture (routine x 2)     Status: None (Preliminary result)   Collection Time: 04/16/18 11:26 AM  Result Value Ref Range Status   Specimen Description   Final    LEFT ANTECUBITAL Performed at [facility], [address] [place], [place] [address]    Special Requests   Final    BOTTLES DRAWN AEROBIC AND ANAEROBIC Blood Culture adequate volume Performed at [facility], [address], [place], [place] [address]    Culture   Final    NO GROWTH 2 DAYS Performed at [facility], [address], [place], [place] [address]    Report Status PENDING  Incomplete     Time coordinating discharge:33  minutes  SIGNED:   Alwyn Ren, MD  [facility] 04/18/2018, 8:40 AM Pager   If 7PM-7AM, please contact night-coverage [email or web address] Password [email or web address]

## 2018-04-18 NOTE — Progress Notes (Signed)
Client took a shower with no oxygen-current stats on room air are 92% and pulse at 72.

## 2018-04-18 NOTE — Progress Notes (Signed)
Reviewed discharge paperwork with patient, follow up appointments, & medication regimen. Patient taken downstairs via wheelchair.

## 2018-04-19 ENCOUNTER — Telehealth: Payer: Self-pay | Admitting: Behavioral Health

## 2018-04-19 NOTE — Telephone Encounter (Signed)
Transition Care Management Follow-up Telephone Call  PCP: Anne Ng, NP  Admit date: 04/16/2018 Discharge date: 04/18/2018  Admitted From: Home Disposition: Home  Recommendations for Outpatient Follow-up:  1. Follow up with PCP in 1-2 weeks 2. Please obtain BMP/CBC in one week   Home Health none Equipment/Devices: None  Discharge Condition stable   How have you been since you were released from the hospital? Patient stated, "I'm feeling a lot better, the mucus is coming out, but I'm still moving kind of slow right now".   Do you understand why you were in the hospital? yes   Do you understand the discharge instructions? yes   Where were you discharged to? Home   Items Reviewed:  Medications reviewed: yes  Allergies reviewed: yes  Dietary changes reviewed: yes, heart healthy diet.  Referrals reviewed: yes, Follow up with PCP in 1-2 weeks; Please obtain BMP/CBC in one week.   Functional Questionnaire:   Activities of Daily Living (ADLs):   She states they are independent in the following: ambulation, bathing and hygiene, feeding, continence, grooming, toileting and dressing States they require assistance with the following: None   Any transportation issues/concerns?: no   Any patient concerns? no   Confirmed importance and date/time of follow-up visits scheduled yes, 04/23/2018 at 11:00 AM.  Provider Appointment booked with Alysia Penna, NP.  Confirmed with patient if condition begins to worsen call PCP or go to the ER.  Patient was given the office number and encouraged to call back with question or concerns.  : yes

## 2018-04-21 LAB — CULTURE, BLOOD (ROUTINE X 2)
Culture: NO GROWTH
Culture: NO GROWTH
Special Requests: ADEQUATE
Special Requests: ADEQUATE

## 2018-04-23 ENCOUNTER — Encounter: Payer: Self-pay | Admitting: Nurse Practitioner

## 2018-04-23 ENCOUNTER — Ambulatory Visit (INDEPENDENT_AMBULATORY_CARE_PROVIDER_SITE_OTHER): Payer: 59 | Admitting: Nurse Practitioner

## 2018-04-23 VITALS — BP 124/80 | HR 91 | Temp 98.3°F | Ht 62.5 in | Wt 236.0 lb

## 2018-04-23 DIAGNOSIS — E1159 Type 2 diabetes mellitus with other circulatory complications: Secondary | ICD-10-CM

## 2018-04-23 DIAGNOSIS — Z720 Tobacco use: Secondary | ICD-10-CM

## 2018-04-23 DIAGNOSIS — I10 Essential (primary) hypertension: Secondary | ICD-10-CM | POA: Diagnosis not present

## 2018-04-23 DIAGNOSIS — J441 Chronic obstructive pulmonary disease with (acute) exacerbation: Secondary | ICD-10-CM | POA: Diagnosis not present

## 2018-04-23 DIAGNOSIS — E119 Type 2 diabetes mellitus without complications: Secondary | ICD-10-CM

## 2018-04-23 DIAGNOSIS — Z794 Long term (current) use of insulin: Secondary | ICD-10-CM

## 2018-04-23 LAB — CBC WITH DIFFERENTIAL/PLATELET
BASOS ABS: 0.1 10*3/uL (ref 0.0–0.1)
Basophils Relative: 0.8 % (ref 0.0–3.0)
EOS ABS: 0.1 10*3/uL (ref 0.0–0.7)
Eosinophils Relative: 0.8 % (ref 0.0–5.0)
HCT: 45.7 % (ref 36.0–46.0)
Hemoglobin: 15 g/dL (ref 12.0–15.0)
Lymphocytes Relative: 16.1 % (ref 12.0–46.0)
Lymphs Abs: 2 10*3/uL (ref 0.7–4.0)
MCHC: 32.8 g/dL (ref 30.0–36.0)
MCV: 95.7 fl (ref 78.0–100.0)
MONO ABS: 0.4 10*3/uL (ref 0.1–1.0)
Monocytes Relative: 3.4 % (ref 3.0–12.0)
Neutro Abs: 10 10*3/uL — ABNORMAL HIGH (ref 1.4–7.7)
Neutrophils Relative %: 78.9 % — ABNORMAL HIGH (ref 43.0–77.0)
Platelets: 290 10*3/uL (ref 150.0–400.0)
RBC: 4.78 Mil/uL (ref 3.87–5.11)
RDW: 13.7 % (ref 11.5–15.5)
WBC: 12.7 10*3/uL — ABNORMAL HIGH (ref 4.0–10.5)

## 2018-04-23 LAB — BASIC METABOLIC PANEL
BUN: 17 mg/dL (ref 6–23)
CO2: 30 mEq/L (ref 19–32)
Calcium: 9.8 mg/dL (ref 8.4–10.5)
Chloride: 100 mEq/L (ref 96–112)
Creatinine, Ser: 0.94 mg/dL (ref 0.40–1.20)
GFR: 74.48 mL/min (ref >60.00)
Glucose, Bld: 186 mg/dL — ABNORMAL HIGH (ref 70–99)
Potassium: 4.2 mEq/L (ref 3.5–5.1)
Sodium: 141 mEq/L (ref 135–145)

## 2018-04-23 LAB — MICROALBUMIN / CREATININE URINE RATIO
Creatinine,U: 56.1 mg/dL
MICROALB/CREAT RATIO: 1.2 mg/g (ref 0.0–30.0)
Microalb, Ur: <0.7 mg/dL (ref 0.0–1.9)

## 2018-04-23 LAB — HEMOGLOBIN A1C: HEMOGLOBIN A1C: 7.3 % — AB (ref 4.6–6.5)

## 2018-04-23 LAB — GLUCOSE, POCT (MANUAL RESULT ENTRY): POC Glucose: 201 mg/dl — AB (ref 70–99)

## 2018-04-23 MED ORDER — SYMBICORT 160-4.5 MCG/ACT IN AERO: 2.0000 | INHALATION_SPRAY | Freq: Two times a day (BID) | RESPIRATORY_TRACT | 6 refills | Status: DC

## 2018-04-23 MED ORDER — HYDROCHLOROTHIAZIDE 12.5 MG PO TABS: 12.5000 mg | ORAL_TABLET | Freq: Every day | ORAL | 1 refills | Status: DC

## 2018-04-23 MED ORDER — EMPAGLIFLOZIN 10 MG PO TABS: 10.0000 mg | ORAL_TABLET | Freq: Every day | ORAL | 1 refills | Status: DC

## 2018-04-23 MED ORDER — ALBUTEROL SULFATE 108 (90 BASE) MCG/ACT IN AEPB: 1.0000 | INHALATION_SPRAY | Freq: Four times a day (QID) | RESPIRATORY_TRACT | 1 refills | Status: AC | PRN

## 2018-04-23 NOTE — Progress Notes (Signed)
Subjective:  Patient ID: Judy Fischer, female    DOB: 09-26-1960  Age: 58 y.o. MRN: [id number]  CC: Hospitalization Follow-up (ED follow up for flu/)  HPI Hospitalization: 2/10-2/02/2019 She present in office with SOB and hypoxia on 2/20. During this visit, she tested positive for influenza and O2 Sat on 86%RA. In the hospital she was found to be septic with possible SIRS. She was treated with IV solumedrol, tamiflu, and IV fluids. hospitalization was impacted by DM and current tobacco use, so she needed insulin injections and nicotine patch. She was then discharge home with oral prednisone and nicotine patch. Reviewewed medication list with patient. Reviewed radiology report and lab results.  Today she reports improved cough and SOB. Ongoing use of oral prednisone and inhalers. Tobacco use: Nicotine patch prescribed in hospital Started nicotine patch today. Last cigarette this morning. Resolved hypoxia.  DM:  taking medication as prescribed (jardiance, glipizide, and levemir) Last hgbA1c 8.6 Has not checked glucose at home. Glucose trend in hospital: 150-300. No urinary symptoms. BP at goal LDL and HDL at goal.  HTN: Controlled with HCTZ BP Readings from Last 3 Encounters:  04/23/18 124/80  04/18/18 (!) 125/92  04/16/18 (!) 144/78   Reviewed past Medical, Social and Family history today.  Outpatient Medications Prior to Visit  Medication Sig Dispense Refill  . aspirin 81 MG tablet Take 81 mg by mouth daily.    . Continuous Blood Gluc Receiver (FREESTYLE LIBRE 14 DAY READER) DEVI 1 Units by Does not apply route once a week. 1 Device 0  . Continuous Blood Gluc Sensor (FREESTYLE LIBRE 14 DAY SENSOR) MISC APPLY 1 SENSOR AS DIRECTED EVERY 14 DAYS, CHECK BLOOD SUGAR TWICE DAILY 2 each 0  . diphenhydramine-acetaminophen (TYLENOL PM) 25-500 MG TABS tablet Take 1 tablet by mouth at bedtime as needed (sleep).     . fluticasone (FLONASE) 50 MCG/ACT nasal spray Place 1 spray into  both nostrils daily. 16 g 3  . glipiZIDE (GLUCOTROL XL) 2.5 MG 24 hr tablet Take 1 tablet (2.5 mg total) by mouth daily with breakfast. 30 tablet 1  . Insulin Detemir (LEVEMIR FLEXTOUCH) 100 UNIT/ML Pen Inject 30 Units into the skin at bedtime. 15 mL 5  . Insulin Pen Needle (NOVOFINE) 32G X 6 MM MISC Use once daily with administration of insulin DX E11.59 100 each 11  . nystatin-triamcinolone (MYCOLOG II) cream Apply 1 application topically 2 (two) times daily. 30 g 0  . omeprazole (PRILOSEC) 20 MG capsule Take 1 capsule (20 mg total) by mouth daily. 30 capsule 3  . oseltamivir (TAMIFLU) 75 MG capsule Take 1 capsule (75 mg total) by mouth 2 (two) times daily. 5 capsule 0  . predniSONE (DELTASONE) 10 MG tablet Take 40 mg that is 4 tablets for the first 3 days then 30 mg that is 3 tablets for the next 3 days then 20 mg for the following 3 days and then 10 mg daily till done. 30 tablet 0  . trolamine salicylate (ASPERCREME) 10 % cream Apply 1 application topically as needed for muscle pain.    . Albuterol Sulfate (PROAIR RESPICLICK) 108 (90 Base) MCG/ACT AEPB Inhale 1 puff into the lungs every 6 (six) hours as needed. 1 each 1  . empagliflozin (JARDIANCE) 10 MG TABS tablet Take 10 mg by mouth at bedtime. 30 tablet 1  . hydrochlorothiazide (HYDRODIURIL) 12.5 MG tablet Take 1 tablet (12.5 mg total) by mouth daily. 30 tablet 1  . SYMBICORT 160-4.5 MCG/ACT inhaler Inhale 2  puffs into the lungs 2 (two) times daily. FOR COPD 1 Inhaler 6   Facility-Administered Medications Prior to Visit  Medication Dose Route Frequency Provider Last Rate Last Dose  . ipratropium-albuterol (DUONEB) 0.5-2.5 (3) MG/3ML nebulizer solution 3 mL  3 mL Nebulization Q6H Robin Petrakis, Bonna Gains, NP   3 mL at 04/16/18 0946    ROS See HPI  Objective:  BP 124/80   Pulse 91   Temp 98.3 F (36.8 C) (Oral)   Ht 5' 2.5" (1.588 m)   Wt 236 lb (107 kg)   LMP 03/07/2006 (Approximate)   SpO2 93%   BMI 42.48 kg/m   BP Readings from  Last 3 Encounters:  04/23/18 124/80  04/18/18 (!) 125/92  04/16/18 (!) 144/78    Wt Readings from Last 3 Encounters:  04/23/18 236 lb (107 kg)  04/16/18 239 lb (108.4 kg)  04/16/18 239 lb 9.6 oz (108.7 kg)    Physical Exam Cardiovascular:     Rate and Rhythm: Normal rate and regular rhythm.  Pulmonary:     Effort: Pulmonary effort is normal. No respiratory distress.     Breath sounds: Wheezing present. No rales.  Musculoskeletal:     Right lower leg: No edema.     Left lower leg: No edema.  Neurological:     Mental Status: She is alert and oriented to person, place, and time.  Psychiatric:        Mood and Affect: Mood normal.        Behavior: Behavior normal.     Lab Results  Component Value Date   WBC 12.7 (H) 04/23/2018   HGB 15.0 04/23/2018   HCT 45.7 04/23/2018   PLT 290.0 04/23/2018   GLUCOSE 186 (H) 04/23/2018   CHOL 135 11/06/2017   TRIG 67 11/06/2017   HDL 49 11/06/2017   LDLCALC 73 11/06/2017   ALT 21 04/18/2018   AST 17 04/18/2018   NA 141 04/23/2018   K 4.2 04/23/2018   CL 100 04/23/2018   CREATININE 0.94 04/23/2018   BUN 17 04/23/2018   CO2 30 04/23/2018   TSH 0.592 04/16/2018   INR 1.0 11/07/2017   HGBA1C 7.3 (H) 04/23/2018   MICROALBUR <0.7 04/23/2018    Dg Chest Port 1 View  Result Date: 04/16/2018 CLINICAL DATA:  Shortness of breath and fever EXAM: PORTABLE CHEST 1 VIEW COMPARISON:  May 12, 2017 FINDINGS: There is no edema or consolidation. Heart size and pulmonary vascularity are normal. No adenopathy. No bone lesions. IMPRESSION: No edema or consolidation. Electronically Signed   By: Bretta Bang III M.D.   On: 04/16/2018 11:55    Assessment & Plan:   Judy Fischer was seen today for hospitalization follow-up.  Diagnoses and all orders for this visit:  Chronic obstructive pulmonary disease with acute exacerbation (HCC) -     CBC w/Diff -     Albuterol Sulfate (PROAIR RESPICLICK) 108 (90 Base) MCG/ACT AEPB; Inhale 1 puff into the  lungs every 6 (six) hours as needed. -     SYMBICORT 160-4.5 MCG/ACT inhaler; Inhale 2 puffs into the lungs 2 (two) times daily. FOR COPD  Type 2 diabetes mellitus without complication, with long-term current use of insulin (HCC) -     Hemoglobin A1c -     Microalbumin / creatinine urine ratio -     POCT Glucose (CBG) -     empagliflozin (JARDIANCE) 10 MG TABS tablet; Take 10 mg by mouth at bedtime.  Hypertension associated with diabetes (HCC) -  Basic metabolic panel -     hydrochlorothiazide (HYDRODIURIL) 12.5 MG tablet; Take 1 tablet (12.5 mg total) by mouth daily.  Continuous tobacco abuse   I am having Nyelli L. Ridings maintain her aspirin, diphenhydramine-acetaminophen, Insulin Pen Needle, fluticasone, Insulin Detemir, nystatin-triamcinolone, omeprazole, glipiZIDE, FREESTYLE LIBRE 14 DAY READER, FREESTYLE LIBRE 14 DAY SENSOR, trolamine salicylate, oseltamivir, predniSONE, Albuterol Sulfate, empagliflozin, hydrochlorothiazide, and SYMBICORT. We will continue to administer ipratropium-albuterol.  Meds ordered this encounter  Medications  . Albuterol Sulfate (PROAIR RESPICLICK) 108 (90 Base) MCG/ACT AEPB    Sig: Inhale 1 puff into the lungs every 6 (six) hours as needed.    Dispense:  1 each    Refill:  1    Order Specific Question:   Supervising Provider    Answer:   Dianne DunARON, TALIA M [[id number]]  . empagliflozin (JARDIANCE) 10 MG TABS tablet    Sig: Take 10 mg by mouth at bedtime.    Dispense:  90 tablet    Refill:  1    Order Specific Question:   Supervising Provider    Answer:   Dianne DunARON, TALIA M [[id number]]  . hydrochlorothiazide (HYDRODIURIL) 12.5 MG tablet    Sig: Take 1 tablet (12.5 mg total) by mouth daily.    Dispense:  90 tablet    Refill:  1    Order Specific Question:   Supervising Provider    Answer:   Dianne DunARON, TALIA M [[id number]]  . SYMBICORT 160-4.5 MCG/ACT inhaler    Sig: Inhale 2 puffs into the lungs 2 (two) times daily. FOR COPD    Dispense:  1 Inhaler    Refill:  6    Order  Specific Question:   Supervising Provider    Answer:   Dianne DunARON, TALIA M [[id number]]    Problem List Items Addressed This Visit      Cardiovascular and Mediastinum   Hypertension associated with diabetes (HCC)   Relevant Medications   empagliflozin (JARDIANCE) 10 MG TABS tablet   hydrochlorothiazide (HYDRODIURIL) 12.5 MG tablet   Other Relevant Orders   Basic metabolic panel (Completed)     Respiratory   Chronic obstructive pulmonary disease with acute exacerbation (HCC) - Primary    Acute exacerbation due to influenza, which led to hospitalization Resolved hypoxia. Advised to continue nicotine patch to help with tobacco cessation. She is not to smoke while wearing patches.  Current use of oral prednisone, albuterol and symbicort. F/up in 4weeks       Relevant Medications   Albuterol Sulfate (PROAIR RESPICLICK) 108 (90 Base) MCG/ACT AEPB   SYMBICORT 160-4.5 MCG/ACT inhaler   Other Relevant Orders   CBC w/Diff (Completed)     Endocrine   Type 2 diabetes mellitus without complication, with long-term current use of insulin (HCC)    Improved DM with hgbA1c of 7.3. Normal urine microalbumin Hold glipizide Continue jardiance and levemir.  F/up in 4weeks with glucose readings       Relevant Medications   empagliflozin (JARDIANCE) 10 MG TABS tablet   Other Relevant Orders   Hemoglobin A1c (Completed)   Microalbumin / creatinine urine ratio (Completed)   POCT Glucose (CBG) (Completed)     Other   Continuous tobacco abuse    Tobacco use: Nicotine patch prescribed in hospital Started nicotine patch today. Last cigarette this morning.            Follow-up: Return in about 4 weeks (around 05/21/2018) for DM and COPD.  Alysia Pennaharlotte Fahd Galea, NP

## 2018-04-23 NOTE — Patient Instructions (Addendum)
Stable cbc and BMP. Improved DM with hgbA1c of 7.3. Hold glipizide Continue jardiance and levemir. F/up in 4weeks with glucose readings as discussed  Check glucose before breakfast and before dinner. Bring readings to next office visit.  Return to office sooner if glucose >250 for more than 2days.  Maintain low carb and low sugar diet, and adequate oral hydration. Use nicotine patch to quit tobacco use.  Complete oral prednisone as prescribed.  Hyperglycemia Hyperglycemia is when the sugar (glucose) level in your blood is too high. It may not cause symptoms. If you do have symptoms, they may include warning signs, such as:  Feeling more thirsty than normal.  Hunger.  Feeling tired.  Needing to pee (urinate) more than normal.  Blurry eyesight (vision). You may get other symptoms as it gets worse, such as:  Dry mouth.  Not being hungry (loss of appetite).  Fruity-smelling breath.  Weakness.  Weight gain or loss that is not planned. Weight loss may be fast.  A tingling or numb feeling in your hands or feet.  Headache.  Skin that does not bounce back quickly when it is lightly pinched and released (poor skin turgor).  Pain in your belly (abdomen).  Cuts or bruises that heal slowly. High blood sugar can happen to people who do or do not have diabetes. High blood sugar can happen slowly or quickly, and it can be an emergency. Follow these instructions at home: General instructions  Take over-the-counter and prescription medicines only as told by your doctor.  Do not use products that contain nicotine or tobacco, such as cigarettes and e-cigarettes. If you need help quitting, ask your doctor.  Limit alcohol intake to no more than 1 drink per day for nonpregnant women and 2 drinks per day for men. One drink equals 12 oz of beer, 5 oz of wine, or 1 oz of hard liquor.  Manage stress. If you need help with this, ask your doctor.  Keep all follow-up visits as told by  your doctor. This is important. Eating and drinking   Stay at a healthy weight.  Exercise regularly, as told by your doctor.  Drink enough fluid, especially when you: ? Exercise. ? Get sick. ? Are in hot temperatures.  Eat healthy foods, such as: ? Low-fat (lean) proteins. ? Complex carbs (complex carbohydrates), such as whole wheat bread or brown rice. ? Fresh fruits and vegetables. ? Low-fat dairy products. ? Healthy fats.  Drink enough fluid to keep your pee (urine) clear or pale yellow. If you have diabetes:   Make sure you know the symptoms of hyperglycemia.  Follow your diabetes management plan, as told by your doctor. Make sure you: ? Take insulin and medicines as told. ? Follow your exercise plan. ? Follow your meal plan. Eat on time. Do not skip meals. ? Check your blood sugar as often as told. Make sure to check before and after exercise. If you exercise longer or in a different way than you normally do, check your blood sugar more often. ? Follow your sick day plan whenever you cannot eat or drink normally. Make this plan ahead of time with your doctor.  Share your diabetes management plan with people in your workplace, school, and household.  Check your urine for ketones when you are ill and as told by your doctor.  Carry a card or wear jewelry that says that you have diabetes. Contact a doctor if:  Your blood sugar level is higher than 240 mg/dL (74.7  mmol/L) for 2 days in a row.  You have problems keeping your blood sugar in your target range.  High blood sugar happens often for you. Get help right away if:  You have trouble breathing.  You have a change in how you think, feel, or act (mental status).  You feel sick to your stomach (nauseous), and that feeling does not go away.  You cannot stop throwing up (vomiting). These symptoms may be an emergency. Do not wait to see if the symptoms will go away. Get medical help right away. Call your local  emergency services (911 in the [place] Do not drive yourself to the hospital. Summary  Hyperglycemia is when the sugar (glucose) level in your blood is too high.  High blood sugar can happen to people who do or do not have diabetes.  Make sure you drink enough fluids, eat healthy foods, and exercise regularly.  Contact your doctor if you have problems keeping your blood sugar in your target range. This information is not intended to replace advice given to you by your health care provider. Make sure you discuss any questions you have with your health care provider. Document Released: 12/19/2008 Document Revised: 11/09/2015 Document Reviewed: 11/09/2015 Elsevier Interactive Patient Education  2019 [employer].

## 2018-04-24 ENCOUNTER — Ambulatory Visit: Payer: 59 | Admitting: Nurse Practitioner

## 2018-04-24 NOTE — Assessment & Plan Note (Signed)
Tobacco use: Nicotine patch prescribed in hospital Started nicotine patch today. Last cigarette this morning.

## 2018-04-24 NOTE — Assessment & Plan Note (Signed)
Improved DM with hgbA1c of 7.3. Normal urine microalbumin Hold glipizide Continue jardiance and levemir.  F/up in 4weeks with glucose readings

## 2018-04-24 NOTE — Assessment & Plan Note (Signed)
Acute exacerbation due to influenza, which led to hospitalization Resolved hypoxia. Advised to continue nicotine patch to help with tobacco cessation. She is not to smoke while wearing patches.  Current use of oral prednisone, albuterol and symbicort. F/up in 4weeks

## 2018-04-26 ENCOUNTER — Encounter: Payer: Self-pay | Admitting: Nurse Practitioner

## 2018-05-17 ENCOUNTER — Other Ambulatory Visit: Payer: Self-pay | Admitting: Nurse Practitioner

## 2018-05-17 DIAGNOSIS — Z1231 Encounter for screening mammogram for malignant neoplasm of breast: Secondary | ICD-10-CM

## 2018-05-23 ENCOUNTER — Ambulatory Visit (INDEPENDENT_AMBULATORY_CARE_PROVIDER_SITE_OTHER): Payer: 59 | Admitting: Nurse Practitioner

## 2018-05-23 ENCOUNTER — Encounter: Payer: Self-pay | Admitting: Nurse Practitioner

## 2018-05-23 ENCOUNTER — Telehealth: Payer: Self-pay | Admitting: Nurse Practitioner

## 2018-05-23 ENCOUNTER — Other Ambulatory Visit: Payer: Self-pay

## 2018-05-23 VITALS — BP 126/84 | HR 85 | Temp 98.1°F | Ht 62.5 in | Wt 235.6 lb

## 2018-05-23 DIAGNOSIS — E119 Type 2 diabetes mellitus without complications: Secondary | ICD-10-CM | POA: Diagnosis not present

## 2018-05-23 DIAGNOSIS — E1159 Type 2 diabetes mellitus with other circulatory complications: Secondary | ICD-10-CM | POA: Diagnosis not present

## 2018-05-23 DIAGNOSIS — K2100 Gastro-esophageal reflux disease with esophagitis, without bleeding: Secondary | ICD-10-CM

## 2018-05-23 DIAGNOSIS — Z794 Long term (current) use of insulin: Principal | ICD-10-CM

## 2018-05-23 DIAGNOSIS — I10 Essential (primary) hypertension: Secondary | ICD-10-CM

## 2018-05-23 DIAGNOSIS — I152 Hypertension secondary to endocrine disorders: Secondary | ICD-10-CM

## 2018-05-23 DIAGNOSIS — R0989 Other specified symptoms and signs involving the circulatory and respiratory systems: Secondary | ICD-10-CM | POA: Diagnosis not present

## 2018-05-23 MED ORDER — PANTOPRAZOLE SODIUM 20 MG PO TBEC: 20.0000 mg | DELAYED_RELEASE_TABLET | Freq: Two times a day (BID) | ORAL | 0 refills | Status: DC

## 2018-05-23 MED ORDER — FREESTYLE LIBRE 14 DAY SENSOR MISC: 1.0000 [IU] | 5 refills | Status: DC

## 2018-05-23 NOTE — Patient Instructions (Signed)
Continue current jardiance and levemir.  Continue to hold glypizide.  Discontinue omeprazole. Start pantoprazole 20mg  BID If no improvement in 2weeks, will need referral  To GI.  Strongly recommend discontinuation of tobacco use.

## 2018-05-23 NOTE — Telephone Encounter (Signed)
Copied from CRM [id number]. Topic: Quick Communication - Rx Refill/Question >> May 23, 2018 11:16 AM Wyonia Hough E wrote: Medication: Continuous Blood Gluc Sensor (FREESTYLE LIBRE 14 DAY SENSOR) MISC\ Pt is coming in for an appointment today at 3pm and needs this called in before so she can bring it to her appt and have NP Nche put it on for her.   Has the patient contacted their pharmacy? Yes - pharmacy advised it was not authorized by Dr   Preferred Pharmacy (with phone number or street name): [id number] [place], [place] - [address] [phone] (Phone) [phone] (Fax)    Agent: Please be advised that RX refills may take up to 3 business days. We ask that you follow-up with your pharmacy.

## 2018-05-23 NOTE — Telephone Encounter (Signed)
Rs sent, pt aware.

## 2018-05-23 NOTE — Progress Notes (Signed)
Subjective:  Patient ID: Judy Fischer, female    DOB: April 05, 1960  Age: 58 y.o. MRN: [id number]  CC: Follow-up (4 wk follow up/BS reading running in 100-150 fasting--but on 05/19/2018 267--not control food. FYI--sore troat only--going for 2 mo. )  Sore Throat   This is a chronic problem. The current episode started more than 1 year ago. The problem has been unchanged. There has been no fever. Associated symptoms include a hoarse voice. Pertinent negatives include no congestion, coughing, plugged ear sensation, shortness of breath, stridor, swollen glands, trouble swallowing or vomiting. Associated symptoms comments: Intermittent dysphagia with solid food. She has had no exposure to strep or mono. Treatments tried: omeprazole . The treatment provided no relief.  no change in sore throat and dysphagia with use of symbicort. Admits to snoring at bedtime. Unsure about apneic episodes at bedtime. persistent tobacco use.  DM: Improve glucose control with jardiance and levemir No hypoglycemic episodes. Home glucose readings: 100-150 (fasting). Last hgbA1c 7.3 7month ago. Negative urine microalbumin.  HTN: BP at goal with HCTZ BP Readings from Last 3 Encounters:  05/23/18 126/84  04/23/18 124/80  04/18/18 (!) 125/92   Reviewed past Medical, Social and Family history today.  Outpatient Medications Prior to Visit  Medication Sig Dispense Refill  . Albuterol Sulfate (PROAIR RESPICLICK) 108 (90 Base) MCG/ACT AEPB Inhale 1 puff into the lungs every 6 (six) hours as needed. 1 each 1  . aspirin 81 MG tablet Take 81 mg by mouth daily.    . Continuous Blood Gluc Receiver (FREESTYLE LIBRE 14 DAY READER) DEVI 1 Units by Does not apply route once a week. 1 Device 0  . Continuous Blood Gluc Sensor (FREESTYLE LIBRE 14 DAY SENSOR) MISC Inject 1 Units into the skin every 14 (fourteen) days. 2 each 5  . diphenhydramine-acetaminophen (TYLENOL PM) 25-500 MG TABS tablet Take 1 tablet by mouth at bedtime  as needed (sleep).     . empagliflozin (JARDIANCE) 10 MG TABS tablet Take 10 mg by mouth at bedtime. 90 tablet 1  . fluticasone (FLONASE) 50 MCG/ACT nasal spray Place 1 spray into both nostrils daily. 16 g 3  . hydrochlorothiazide (HYDRODIURIL) 12.5 MG tablet Take 1 tablet (12.5 mg total) by mouth daily. 90 tablet 1  . Insulin Detemir (LEVEMIR FLEXTOUCH) 100 UNIT/ML Pen Inject 30 Units into the skin at bedtime. 15 mL 5  . Insulin Pen Needle (NOVOFINE) 32G X 6 MM MISC Use once daily with administration of insulin DX E11.59 100 each 11  . nystatin-triamcinolone (MYCOLOG II) cream Apply 1 application topically 2 (two) times daily. 30 g 0  . SYMBICORT 160-4.5 MCG/ACT inhaler Inhale 2 puffs into the lungs 2 (two) times daily. FOR COPD 1 Inhaler 6  . trolamine salicylate (ASPERCREME) 10 % cream Apply 1 application topically as needed for muscle pain.    Marland Kitchen omeprazole (PRILOSEC) 20 MG capsule Take 1 capsule (20 mg total) by mouth daily. 30 capsule 3  . glipiZIDE (GLUCOTROL XL) 2.5 MG 24 hr tablet Take 1 tablet (2.5 mg total) by mouth daily with breakfast. (Patient not taking: Reported on 05/23/2018) 30 tablet 1  . oseltamivir (TAMIFLU) 75 MG capsule Take 1 capsule (75 mg total) by mouth 2 (two) times daily. (Patient not taking: Reported on 05/23/2018) 5 capsule 0  . predniSONE (DELTASONE) 10 MG tablet Take 40 mg that is 4 tablets for the first 3 days then 30 mg that is 3 tablets for the next 3 days then 20 mg for the  following 3 days and then 10 mg daily till done. (Patient not taking: Reported on 05/23/2018) 30 tablet 0   Facility-Administered Medications Prior to Visit  Medication Dose Route Frequency Provider Last Rate Last Dose  . ipratropium-albuterol (DUONEB) 0.5-2.5 (3) MG/3ML nebulizer solution 3 mL  3 mL Nebulization Q6H Luv Mish, Bonna Gains, NP   3 mL at 04/16/18 0946    ROS See HPI  Objective:  BP 126/84   Pulse 85   Temp 98.1 F (36.7 C) (Oral)   Ht 5' 2.5" (1.588 m)   Wt 235 lb 9.6 oz  (106.9 kg)   LMP 03/07/2006 (Approximate)   SpO2 94%   BMI 42.41 kg/m   BP Readings from Last 3 Encounters:  05/23/18 126/84  04/23/18 124/80  04/18/18 (!) 125/92    Wt Readings from Last 3 Encounters:  05/23/18 235 lb 9.6 oz (106.9 kg)  04/23/18 236 lb (107 kg)  04/16/18 239 lb (108.4 kg)    Physical Exam Vitals signs reviewed.  HENT:     Right Ear: Tympanic membrane, ear canal and external ear normal.     Left Ear: Tympanic membrane, ear canal and external ear normal.     Nose: Nose normal.     Mouth/Throat:     Pharynx: Posterior oropharyngeal erythema present.  Neck:     Musculoskeletal: Normal range of motion and neck supple.  Cardiovascular:     Rate and Rhythm: Normal rate and regular rhythm.     Pulses: Normal pulses.     Heart sounds: Normal heart sounds.  Pulmonary:     Effort: Pulmonary effort is normal.     Breath sounds: Normal breath sounds.  Lymphadenopathy:     Cervical: No cervical adenopathy.  Neurological:     Mental Status: She is alert and oriented to person, place, and time.  Psychiatric:        Mood and Affect: Mood normal.        Behavior: Behavior normal.        Thought Content: Thought content normal.     Lab Results  Component Value Date   WBC 12.7 (H) 04/23/2018   HGB 15.0 04/23/2018   HCT 45.7 04/23/2018   PLT 290.0 04/23/2018   GLUCOSE 186 (H) 04/23/2018   CHOL 135 11/06/2017   TRIG 67 11/06/2017   HDL 49 11/06/2017   LDLCALC 73 11/06/2017   ALT 21 04/18/2018   AST 17 04/18/2018   NA 141 04/23/2018   K 4.2 04/23/2018   CL 100 04/23/2018   CREATININE 0.94 04/23/2018   BUN 17 04/23/2018   CO2 30 04/23/2018   TSH 0.592 04/16/2018   INR 1.0 11/07/2017   HGBA1C 7.3 (H) 04/23/2018   MICROALBUR <0.7 04/23/2018    Dg Chest Port 1 View  Result Date: 04/16/2018 CLINICAL DATA:  Shortness of breath and fever EXAM: PORTABLE CHEST 1 VIEW COMPARISON:  May 12, 2017 FINDINGS: There is no edema or consolidation. Heart size and  pulmonary vascularity are normal. No adenopathy. No bone lesions. IMPRESSION: No edema or consolidation. Electronically Signed   By: Bretta Bang III M.D.   On: 04/16/2018 11:55    Assessment & Plan:   Adolph was seen today for follow-up.  Diagnoses and all orders for this visit:  Type 2 diabetes mellitus without complication, with long-term current use of insulin (HCC)  Hypertension associated with diabetes (HCC)  Gastroesophageal reflux disease with esophagitis -     pantoprazole (PROTONIX) 20 MG tablet; Take 1 tablet (  20 mg total) by mouth 2 (two) times daily.  Globus pharyngeus -     pantoprazole (PROTONIX) 20 MG tablet; Take 1 tablet (20 mg total) by mouth 2 (two) times daily.   I have discontinued Josslynn L. Verba's omeprazole, glipiZIDE, oseltamivir, and predniSONE. I am also having her start on pantoprazole. Additionally, I am having her maintain her aspirin, diphenhydramine-acetaminophen, Insulin Pen Needle, fluticasone, Insulin Detemir, nystatin-triamcinolone, FreeStyle Libre 14 Day Reader, trolamine salicylate, Albuterol Sulfate, empagliflozin, hydrochlorothiazide, Symbicort, and FreeStyle Libre 14 Day Sensor. We will continue to administer ipratropium-albuterol.  Meds ordered this encounter  Medications  . pantoprazole (PROTONIX) 20 MG tablet    Sig: Take 1 tablet (20 mg total) by mouth 2 (two) times daily.    Dispense:  30 tablet    Refill:  0    Order Specific Question:   Supervising Provider    Answer:   MATTHEWS, CODY [[id number]]    Problem List Items Addressed This Visit      Cardiovascular and Mediastinum   Hypertension associated with diabetes (HCC)     Digestive   GERD (gastroesophageal reflux disease)   Relevant Medications   pantoprazole (PROTONIX) 20 MG tablet     Endocrine   Type 2 diabetes mellitus without complication, with long-term current use of insulin (HCC) - Primary    Other Visit Diagnoses    Globus pharyngeus       Relevant Medications    pantoprazole (PROTONIX) 20 MG tablet       Follow-up: Return in about 2 months (around 07/23/2018) for DM and HTN.  Alysia Penna, NP

## 2018-05-26 ENCOUNTER — Other Ambulatory Visit: Payer: Self-pay | Admitting: Nurse Practitioner

## 2018-05-26 DIAGNOSIS — K2100 Gastro-esophageal reflux disease with esophagitis, without bleeding: Secondary | ICD-10-CM

## 2018-05-29 ENCOUNTER — Encounter: Payer: Self-pay | Admitting: Nurse Practitioner

## 2018-05-29 NOTE — Progress Notes (Signed)
Abstracted result and sent to scan  

## 2018-06-20 ENCOUNTER — Other Ambulatory Visit: Payer: Self-pay | Admitting: Nurse Practitioner

## 2018-06-20 DIAGNOSIS — K2100 Gastro-esophageal reflux disease with esophagitis, without bleeding: Secondary | ICD-10-CM

## 2018-06-20 DIAGNOSIS — R09A2 Foreign body sensation, throat: Secondary | ICD-10-CM

## 2018-06-20 DIAGNOSIS — R0989 Other specified symptoms and signs involving the circulatory and respiratory systems: Secondary | ICD-10-CM

## 2018-06-20 NOTE — Telephone Encounter (Signed)
Discontinue omeprazole. If symptoms have improved with pantoprazole/protonix, ok to refill but change dose to 20mg  once a day before breakfast, #90, no refill

## 2018-06-20 NOTE — Telephone Encounter (Signed)
Pt report symptoms improved with protonix and she discontinue omeprazole.   Rx sent with new directions, #90 no refill. Pt is aware.

## 2018-06-20 NOTE — Telephone Encounter (Signed)
Claris Gower please advise, pt saw you on 05/23/2018 and advised:  Discontinue omeprazole. Start pantoprazole 20mg  BID If no improvement in 2weeks, will need referral  To GI.  Pantoprazole sent in on 05/23/2018 for 30 tab BID but we also send in omeprazole once daily for 4 mo supply. Please advise, pt is requesting refill for pantoprazole 20 mg.

## 2018-07-23 ENCOUNTER — Ambulatory Visit: Payer: 59 | Admitting: Nurse Practitioner

## 2018-08-09 ENCOUNTER — Telehealth: Payer: Self-pay | Admitting: Nurse Practitioner

## 2018-08-09 NOTE — Telephone Encounter (Signed)
Patient stated that she received notification that she can receive the needle disposable container with her needles as a package if her insurance would cover it. Patient would like to discuss this with you. Please call patient at [phone].

## 2018-08-10 MED ORDER — INSULIN PEN NEEDLE 32G X 6 MM MISC: 1 refills | Status: DC

## 2018-08-10 NOTE — Telephone Encounter (Signed)
Rx for pen needle sent.   Waiting pharmacist to call back about the disposable container that they are offering the pt.

## 2018-08-10 NOTE — Telephone Encounter (Signed)
Pt request refill for insuline pen needle sent in, ok to send in 6 mo supply? Please advise, her previous PCP sent in last.

## 2018-08-10 NOTE — Telephone Encounter (Signed)
Pt is aware.  

## 2018-08-10 NOTE — Telephone Encounter (Signed)
90days refill She is overdue of f/up appt

## 2018-08-20 ENCOUNTER — Encounter: Payer: Self-pay | Admitting: Nurse Practitioner

## 2018-08-20 ENCOUNTER — Ambulatory Visit (INDEPENDENT_AMBULATORY_CARE_PROVIDER_SITE_OTHER): Payer: 59 | Admitting: Nurse Practitioner

## 2018-08-20 VITALS — BP 127/85 | HR 82 | Ht 62.5 in | Wt 237.6 lb

## 2018-08-20 DIAGNOSIS — I10 Essential (primary) hypertension: Secondary | ICD-10-CM

## 2018-08-20 DIAGNOSIS — Z136 Encounter for screening for cardiovascular disorders: Secondary | ICD-10-CM

## 2018-08-20 DIAGNOSIS — Z794 Long term (current) use of insulin: Secondary | ICD-10-CM | POA: Diagnosis not present

## 2018-08-20 DIAGNOSIS — Z13220 Encounter for screening for lipoid disorders: Secondary | ICD-10-CM | POA: Diagnosis not present

## 2018-08-20 DIAGNOSIS — E119 Type 2 diabetes mellitus without complications: Secondary | ICD-10-CM | POA: Diagnosis not present

## 2018-08-20 NOTE — Patient Instructions (Signed)
Go to lab for blood draw 08/24/18 at 8am. Will advise about f/up after review of lab results. Continue current medications. Send picture of rash through [employer] (provided patient with mychart activation code)

## 2018-08-20 NOTE — Progress Notes (Signed)
For [employer] and video telecommunications were attempted between this provider and patient, however failed, due to patient having technical difficulties OR patient did not have access to video capability.  We continued and completed visit with audio only.   Virtual Visit via Video Note  I connected with Judy Fischer on 08/20/18 at  3:30 PM EDT by a video enabled telemedicine application and verified that I am speaking with the correct person using two identifiers.  Location: Patient: home Provider: office   I discussed the limitations of evaluation and management by telemedicine and the availability of in person appointments. The patient expressed understanding and agreed to proceed.  CC: follow up DM and HTN--pt report BS today 109--pt is out of town as of today--no vital to share. pt mention nystatin-triamcinolone (MYCOLOG II) cream  is not really helping--not sure if there is anything else she can try.   History of Present Illness: Rash: under breast, upper thigh and torso  DM: Controlled with levemir 30units and jardiance AM glucose: 100s PM glucose: 120-150s No hypoglycemia, no dysuria, no vaginal itching or discharge.  HTN: Controlled with HCTZ. BP Readings from Last 3 Encounters:  08/20/18 127/85  05/23/18 126/84  04/23/18 124/80   Describes rash as scaly, itching, patches under breast, and thigh.  Observations/Objective: Normal voice and speech, alert and oriented No physical exam due to televisit.  Assessment and Plan: Judy Fischer was seen today for follow-up.  Diagnoses and all orders for this visit:  Type 2 diabetes mellitus without complication, with long-term current use of insulin (HCC) -     Hemoglobin A1c; Future -     Lipid panel; Future  Encounter for lipid screening for cardiovascular disease -     Lipid panel; Future  Essential hypertension -     Basic metabolic panel; Future   Follow Up Instructions: Go to lab for blood  draw 08/24/18 at 8am. Will advise about f/up after review of lab results. Continue current medications. Send picture of rash through [employer] (provided patient with mychart activation code)   I discussed the assessment and treatment plan with the patient. The patient was provided an opportunity to ask questions and all were answered. The patient agreed with the plan and demonstrated an understanding of the instructions.   The patient was advised to call back or seek an in-person evaluation if the symptoms worsen or if the condition fails to improve as anticipated.  I provided 13 minutes of non-face-to-face time during this encounter.   Wilfred Lacy, NP

## 2018-08-23 ENCOUNTER — Telehealth: Payer: Self-pay

## 2018-08-23 NOTE — Telephone Encounter (Signed)
During this illness, did/does the patient experience any of the following symptoms? Fever >100.33F []   Yes [x]   No []   Unknown Subjective fever (felt feverish) []   Yes [x]   No []   Unknown Chills []   Yes []   No []   Unknown Muscle aches (myalgia) []   Yes [x]   No []   Unknown Runny nose (rhinorrhea) []   Yes []   No []   Unknown Sore throat []   Yes [x]   No []   Unknown Cough (new onset or worsening of chronic cough) []   Yes [x]   No []   Unknown Shortness of breath (dyspnea) []   Yes [x]   No []   Unknown Nausea or vomiting []   Yes [x]   No []   Unknown Headache []   Yes [x]   No []   Unknown Abdominal pain  []   Yes [x]   No []   Unknown Diarrhea (?3 loose/looser than normal stools/24hr period) []   Yes [x]   No []   Unknown Other, specify:

## 2018-08-24 ENCOUNTER — Other Ambulatory Visit (INDEPENDENT_AMBULATORY_CARE_PROVIDER_SITE_OTHER): Payer: 59

## 2018-08-24 DIAGNOSIS — Z794 Long term (current) use of insulin: Secondary | ICD-10-CM

## 2018-08-24 DIAGNOSIS — E119 Type 2 diabetes mellitus without complications: Secondary | ICD-10-CM

## 2018-08-24 DIAGNOSIS — I10 Essential (primary) hypertension: Secondary | ICD-10-CM | POA: Diagnosis not present

## 2018-08-24 DIAGNOSIS — Z136 Encounter for screening for cardiovascular disorders: Secondary | ICD-10-CM | POA: Diagnosis not present

## 2018-08-24 DIAGNOSIS — Z13220 Encounter for screening for lipoid disorders: Secondary | ICD-10-CM | POA: Diagnosis not present

## 2018-08-24 LAB — BASIC METABOLIC PANEL
BUN: 12 mg/dL (ref 6–23)
CO2: 28 mEq/L (ref 19–32)
Calcium: 9.3 mg/dL (ref 8.4–10.5)
Chloride: 105 mEq/L (ref 96–112)
Creatinine, Ser: 0.85 mg/dL (ref 0.40–1.20)
GFR: 83.55 mL/min (ref >60.00)
Glucose, Bld: 102 mg/dL — ABNORMAL HIGH (ref 70–99)
Potassium: 3.9 mEq/L (ref 3.5–5.1)
Sodium: 140 mEq/L (ref 135–145)

## 2018-08-24 LAB — LIPID PANEL
Cholesterol: 123 mg/dL (ref 0–200)
HDL: 36.1 mg/dL — ABNORMAL LOW (ref >39.00)
LDL Cholesterol: 61 mg/dL (ref 0–99)
NonHDL: 86.59
Total CHOL/HDL Ratio: 3
Triglycerides: 128 mg/dL (ref 0.0–149.0)
VLDL: 25.6 mg/dL (ref 0.0–40.0)

## 2018-08-24 LAB — HEMOGLOBIN A1C: Hgb A1c MFr Bld: 7.3 % — ABNORMAL HIGH (ref 4.6–6.5)

## 2018-08-24 NOTE — Addendum Note (Signed)
Addended by: Lynnea Ferrier on: 08/24/2018 07:24 AM   Modules accepted: Orders

## 2018-09-15 ENCOUNTER — Other Ambulatory Visit: Payer: Self-pay | Admitting: Nurse Practitioner

## 2018-09-15 DIAGNOSIS — K2100 Gastro-esophageal reflux disease with esophagitis, without bleeding: Secondary | ICD-10-CM

## 2018-09-15 DIAGNOSIS — R0989 Other specified symptoms and signs involving the circulatory and respiratory systems: Secondary | ICD-10-CM

## 2018-10-11 ENCOUNTER — Encounter: Payer: Self-pay | Admitting: Nurse Practitioner

## 2018-10-11 ENCOUNTER — Ambulatory Visit (INDEPENDENT_AMBULATORY_CARE_PROVIDER_SITE_OTHER): Payer: 59 | Admitting: Nurse Practitioner

## 2018-10-11 ENCOUNTER — Telehealth: Payer: Self-pay | Admitting: Nurse Practitioner

## 2018-10-11 ENCOUNTER — Other Ambulatory Visit: Payer: Self-pay

## 2018-10-11 VITALS — Temp 95.8°F | Ht 62.5 in | Wt 238.0 lb

## 2018-10-11 DIAGNOSIS — K047 Periapical abscess without sinus: Secondary | ICD-10-CM | POA: Diagnosis not present

## 2018-10-11 MED ORDER — AMOXICILLIN-POT CLAVULANATE 875-125 MG PO TABS: 1.0000 | ORAL_TABLET | Freq: Two times a day (BID) | ORAL | 0 refills | Status: DC

## 2018-10-11 MED ORDER — IBUPROFEN 600 MG PO TABS: 600.0000 mg | ORAL_TABLET | Freq: Three times a day (TID) | ORAL | 0 refills | Status: DC | PRN

## 2018-10-11 NOTE — Telephone Encounter (Signed)
Virtual visit 

## 2018-10-11 NOTE — Patient Instructions (Addendum)
Take medications with food.  I strongly recommend for you to make another appt with dentist within next 1week.   Dental Abscess  A dental abscess is an area of pus in or around a tooth. It comes from an infection. It can cause pain and other symptoms. Treatment will help with symptoms and prevent the infection from spreading. Follow these instructions at home: Medicines  Take over-the-counter and prescription medicines only as told by your dentist.  If you were prescribed an antibiotic medicine, take it as told by your dentist. Do not stop taking it even if you start to feel better.  If you were prescribed a gel that has numbing medicine in it, use it exactly as told.  Do not drive or use heavy machinery (like a Conservation officer, nature) while taking prescription pain medicine. General instructions  Rinse out your mouth often with salt water. ? To make salt water, dissolve -1 tsp of salt in 1 cup of warm water.  Eat a soft diet while your mouth is healing.  Drink enough fluid to keep your urine pale yellow.  Do not apply heat to the outside of your mouth.  Do not use any products that contain nicotine or tobacco. These include cigarettes and e-cigarettes. If you need help quitting, ask your doctor.  Keep all follow-up visits as told by your dentist. This is important. Prevent an abscess  Brush your teeth every morning and every night. Use fluoride toothpaste.  Floss your teeth each day.  Get dental cleanings as often as told by your dentist.  Think about getting dental sealant put on teeth that have deep holes (decay).  Drink water that has fluoride in it. ? Most tap water has fluoride. ? Check the label on bottled water to see if it has fluoride in it.  Drink water instead of sugary drinks.  Eat healthy meals and snacks.  Wear a mouth guard or face shield when you play sports. Contact a doctor if:  Your pain is worse, and medicine does not help. Get help right away  if:  You have a fever or chills.  Your symptoms suddenly get worse.  You have a very bad headache.  You have problems breathing or swallowing.  You have trouble opening your mouth.  You have swelling in your neck or close to your eye. Summary  A dental abscess is an area of pus in or around a tooth. It is caused by an infection.  Treatment will help with symptoms and prevent the infection from spreading.  Take over-the-counter and prescription medicines only as told by your dentist.  To prevent an abscess, take good care of your teeth. Brush your teeth every morning and night. Use floss every day.  Get dental cleanings as often as told by your dentist. This information is not intended to replace advice given to you by your health care provider. Make sure you discuss any questions you have with your health care provider. Document Released: 07/08/2014 Document Revised: 06/13/2018 Document Reviewed: 10/24/2016 Elsevier Patient Education  2020 [employer].

## 2018-10-11 NOTE — Progress Notes (Signed)
Virtual Visit via Video Note  I connected with Judy Fischer on 10/11/18 at  9:45 AM EDT by a video enabled telemedicine application and verified that I am speaking with the correct person using two identifiers.  Location: Patient: Home Provider: Office   I discussed the limitations of evaluation and management by telemedicine and the availability of in person appointments. The patient expressed understanding and agreed to proceed.   CC: tooth pain and facial swelling  History of Present Illness: Dental Pain  This is a new problem. The current episode started in the past 7 days. The problem occurs constantly. The problem has been gradually worsening. The pain is severe. Associated symptoms include facial pain, sinus pressure and thermal sensitivity. Pertinent negatives include no difficulty swallowing, fever or oral bleeding. She has tried acetaminophen for the symptoms. The treatment provided no relief.  she canceled appt with dentist last month.  Observations/Objective: Physical Exam  Constitutional: She is oriented to person, place, and time.  HENT:  Right Ear: External ear normal.  Left Ear: External ear normal.  Nose: Right sinus exhibits maxillary sinus tenderness and frontal sinus tenderness. Left sinus exhibits no maxillary sinus tenderness and no frontal sinus tenderness.  Mouth/Throat: No trismus in the jaw. Abnormal dentition. Dental caries present.    Neck: Normal range of motion. Neck supple.  Pulmonary/Chest: Effort normal.  Neurological: She is alert and oriented to person, place, and time.  Skin: No rash noted. She is not diaphoretic. No erythema.   Assessment and Plan: Buffey was seen today for facial swelling.  Diagnoses and all orders for this visit:  Dental abscess -     amoxicillin-clavulanate (AUGMENTIN) 875-125 MG tablet; Take 1 tablet by mouth 2 (two) times daily. -     ibuprofen (ADVIL) 600 MG tablet; Take 1 tablet (600 mg total) by mouth every 8  (eight) hours as needed (with food).   Follow Up Instructions: See avs   I discussed the assessment and treatment plan with the patient. The patient was provided an opportunity to ask questions and all were answered. The patient agreed with the plan and demonstrated an understanding of the instructions.   The patient was advised to call back or seek an in-person evaluation if the symptoms worsen or if the condition fails to improve as anticipated.  Wilfred Lacy, NP

## 2018-10-11 NOTE — Telephone Encounter (Signed)
Pt called stating she needs to get filling done with her tooth but she cancel the appt with the dentis (afraid to go in) due to pandemic. Pt stating today her face is swelling and going down toward her neck/ it is painful. Pt was wondering if Baldo Ash can send in an abx or give her something to help.   Charlotte please advise, offer virtural visit but the pt wants to see what Baldo Ash say first.

## 2018-10-18 ENCOUNTER — Other Ambulatory Visit: Payer: Self-pay | Admitting: Nurse Practitioner

## 2018-10-18 DIAGNOSIS — E1159 Type 2 diabetes mellitus with other circulatory complications: Secondary | ICD-10-CM

## 2018-10-18 DIAGNOSIS — I152 Hypertension secondary to endocrine disorders: Secondary | ICD-10-CM

## 2018-10-18 NOTE — Telephone Encounter (Signed)
rx sent/ due for appt with Nche in 02/2019.

## 2018-12-15 ENCOUNTER — Other Ambulatory Visit: Payer: Self-pay | Admitting: Nurse Practitioner

## 2018-12-15 DIAGNOSIS — E119 Type 2 diabetes mellitus without complications: Secondary | ICD-10-CM

## 2018-12-15 DIAGNOSIS — Z794 Long term (current) use of insulin: Secondary | ICD-10-CM

## 2018-12-17 NOTE — Telephone Encounter (Signed)
Rx sent, pt due for follow up with PCP in 04/2019.

## 2018-12-18 ENCOUNTER — Other Ambulatory Visit: Payer: Self-pay | Admitting: Nurse Practitioner

## 2018-12-18 DIAGNOSIS — R0989 Other specified symptoms and signs involving the circulatory and respiratory systems: Secondary | ICD-10-CM

## 2018-12-18 DIAGNOSIS — R09A2 Foreign body sensation, throat: Secondary | ICD-10-CM

## 2018-12-18 DIAGNOSIS — Z794 Long term (current) use of insulin: Secondary | ICD-10-CM

## 2018-12-18 DIAGNOSIS — K2100 Gastro-esophageal reflux disease with esophagitis, without bleeding: Secondary | ICD-10-CM

## 2018-12-18 DIAGNOSIS — E119 Type 2 diabetes mellitus without complications: Secondary | ICD-10-CM

## 2018-12-19 ENCOUNTER — Telehealth: Payer: Self-pay | Admitting: Nurse Practitioner

## 2018-12-19 DIAGNOSIS — E119 Type 2 diabetes mellitus without complications: Secondary | ICD-10-CM

## 2018-12-19 MED ORDER — LEVEMIR FLEXTOUCH 100 UNIT/ML ~~LOC~~ SOPN: 30.0000 [IU] | PEN_INJECTOR | Freq: Every day | SUBCUTANEOUS | 5 refills | Status: DC

## 2018-12-19 MED ORDER — FREESTYLE LIBRE 14 DAY READER DEVI: 1.0000 [IU] | 0 refills | Status: AC

## 2018-12-19 NOTE — Telephone Encounter (Signed)
Pt stated she was on vacation and forgot her freestyle Astronomer. Please send new on in  Pt stated she is out of her Levemir. Please advise.

## 2018-12-19 NOTE — Telephone Encounter (Signed)
Rx sent 

## 2018-12-19 NOTE — Telephone Encounter (Signed)
Pt is aware.  

## 2018-12-19 NOTE — Telephone Encounter (Signed)
Received refill request from [facility] for Levemir (stated pt lost med, may need temporary supply) and also glucose meter.   LVM for the pt to call back, need to clarify this request.

## 2018-12-19 NOTE — Telephone Encounter (Signed)
Ok to send prescriptions

## 2019-02-18 LAB — HM DIABETES EYE EXAM

## 2019-03-27 ENCOUNTER — Other Ambulatory Visit: Payer: Self-pay | Admitting: Nurse Practitioner

## 2019-03-27 DIAGNOSIS — K2100 Gastro-esophageal reflux disease with esophagitis, without bleeding: Secondary | ICD-10-CM

## 2019-03-27 DIAGNOSIS — R0989 Other specified symptoms and signs involving the circulatory and respiratory systems: Secondary | ICD-10-CM

## 2019-04-02 ENCOUNTER — Telehealth: Payer: Self-pay | Admitting: Nurse Practitioner

## 2019-04-02 NOTE — Telephone Encounter (Signed)
[facility] calling[facility] a [facility]in thearapy, they said they have sent [place] a few things but havent recieved anything back. The best number to reach them back is [phone]

## 2019-04-03 NOTE — Telephone Encounter (Signed)
[facility]rain from [facility] stating that Diabetes association reccommended pt who has DM to be on statin drug to help protect the heart. They are not sure if it something that Claris Gower wants to do, if so please send rx in if not this is just an [place]--

## 2019-04-03 NOTE — Telephone Encounter (Signed)
With LDL at goal, controlled DM and BP at goal; I will hold off on statin at this time. Plan to repeat lipid panel during next OV.

## 2019-04-03 NOTE — Telephone Encounter (Signed)
Left vm for Brain to call back.

## 2019-05-14 ENCOUNTER — Other Ambulatory Visit: Payer: Self-pay | Admitting: Nurse Practitioner

## 2019-05-14 DIAGNOSIS — E119 Type 2 diabetes mellitus without complications: Secondary | ICD-10-CM

## 2019-05-24 ENCOUNTER — Ambulatory Visit: Payer: 59 | Attending: Internal Medicine

## 2019-05-24 DIAGNOSIS — Z23 Encounter for immunization: Secondary | ICD-10-CM

## 2019-05-24 NOTE — Progress Notes (Signed)
   Covid-19 Vaccination Clinic  Name:  CLETIS MUMA    MRN: [id number] DOB: 04-22-1960  05/24/2019  Ms. Scheeler was observed post Covid-19 immunization for 15 minutes without incident. She was provided with Vaccine Information Sheet and instruction to access the V-Safe system.   Ms. Archambeault was instructed to call 911 with any severe reactions post vaccine: Marland Kitchen Difficulty breathing  . Swelling of face and throat  . A fast heartbeat  . A bad rash all over body  . Dizziness and weakness   Immunizations Administered    Name Date Dose VIS Date Route   Pfizer COVID-19 Vaccine 05/24/2019 10:53 AM 0.3 mL 02/15/2019 Intramuscular   Manufacturer: [employer], [employer]   Lot: [id number]   NDC: [address]-7

## 2019-05-28 ENCOUNTER — Other Ambulatory Visit: Payer: Self-pay

## 2019-05-29 ENCOUNTER — Encounter: Payer: Self-pay | Admitting: Nurse Practitioner

## 2019-05-29 ENCOUNTER — Ambulatory Visit (INDEPENDENT_AMBULATORY_CARE_PROVIDER_SITE_OTHER): Payer: 59 | Admitting: Nurse Practitioner

## 2019-05-29 VITALS — BP 122/80 | HR 73 | Temp 97.2°F | Ht 62.0 in | Wt 240.2 lb

## 2019-05-29 DIAGNOSIS — E119 Type 2 diabetes mellitus without complications: Secondary | ICD-10-CM

## 2019-05-29 DIAGNOSIS — M25512 Pain in left shoulder: Secondary | ICD-10-CM | POA: Diagnosis not present

## 2019-05-29 DIAGNOSIS — M25561 Pain in right knee: Secondary | ICD-10-CM | POA: Diagnosis not present

## 2019-05-29 DIAGNOSIS — K219 Gastro-esophageal reflux disease without esophagitis: Secondary | ICD-10-CM

## 2019-05-29 DIAGNOSIS — E1159 Type 2 diabetes mellitus with other circulatory complications: Secondary | ICD-10-CM

## 2019-05-29 DIAGNOSIS — G8929 Other chronic pain: Secondary | ICD-10-CM

## 2019-05-29 DIAGNOSIS — Z794 Long term (current) use of insulin: Secondary | ICD-10-CM | POA: Diagnosis not present

## 2019-05-29 DIAGNOSIS — I10 Essential (primary) hypertension: Secondary | ICD-10-CM | POA: Diagnosis not present

## 2019-05-29 LAB — BASIC METABOLIC PANEL
BUN: 12 mg/dL (ref 6–23)
CO2: 30 mEq/L (ref 19–32)
Calcium: 9.5 mg/dL (ref 8.4–10.5)
Chloride: 102 mEq/L (ref 96–112)
Creatinine, Ser: 0.84 mg/dL (ref 0.40–1.20)
GFR: 84.47 mL/min (ref >60.00)
Glucose, Bld: 114 mg/dL — ABNORMAL HIGH (ref 70–99)
Potassium: 4.7 mEq/L (ref 3.5–5.1)
Sodium: 140 mEq/L (ref 135–145)

## 2019-05-29 LAB — MICROALBUMIN / CREATININE URINE RATIO
Creatinine,U: 57.6 mg/dL
Microalb Creat Ratio: 1.2 mg/g (ref 0.0–30.0)
Microalb, Ur: <0.7 mg/dL (ref 0.0–1.9)

## 2019-05-29 LAB — HEMOGLOBIN A1C: Hgb A1c MFr Bld: 6.5 % (ref 4.6–6.5)

## 2019-05-29 NOTE — Patient Instructions (Addendum)
We will obtain recent eye exam reports.  You will be contacted to schedule appt with sports medicine.  Improved glucose control Decrease levemir to 20uints F/up in 15months

## 2019-05-29 NOTE — Progress Notes (Signed)
Subjective:  Patient ID: Judy Fischer, female    DOB: 1960-05-21  Age: 59 y.o. MRN: [id number]  CC: Follow-up (DM-last readings  March 22-107//March 23- 194//March 24-123//pt reported about a month ago night sweats an sugar dropped to 40's//eye exam a month ago [employer] covid shot 3/19)  HPI DM: Fasting glucose:120s Hypoglycemic episodes due to skipped meals per patient. Negative urine microalbumin, and neuropathy. LDL at goal. BP at goal No dysuria or vaginal discharge with jardiance. BP Readings from Last 3 Encounters:  05/29/19 122/80  08/20/18 127/85  05/23/18 126/84   Wt Readings from Last 3 Encounters:  05/29/19 240 lb 3.2 oz (109 kg)  10/11/18 238 lb (108 kg)  08/20/18 237 lb 9.6 oz (107.8 kg)   She also reports persistent right knee and left shoulder pain, chronic, waxing and waning, associated with stiffness and swelling, minimal improvement with tylenol and NSAIDs. Denies any injury. Right knee DG 03/2017:Osteoarthritic changes RIGHT knee without acute bony abnormalities  Reviewed past Medical, Social and Family history today.  Outpatient Medications Prior to Visit  Medication Sig Dispense Refill  . Albuterol Sulfate (PROAIR RESPICLICK) 073 (90 Base) MCG/ACT AEPB Inhale 1 puff into the lungs every 6 (six) hours as needed. 1 each 1  . aspirin 81 MG tablet Take 81 mg by mouth daily.    . Continuous Blood Gluc Receiver (FREESTYLE LIBRE 14 DAY READER) DEVI 1 Units by Does not apply route once a week. 1 Device 0  . Continuous Blood Gluc Sensor (FREESTYLE LIBRE 14 DAY SENSOR) MISC USE EVERY 14 DAYS AS DIRECTED 2 each 5  . diphenhydramine-acetaminophen (TYLENOL PM) 25-500 MG TABS tablet Take 1 tablet by mouth at bedtime as needed (sleep).     . fluticasone (FLONASE) 50 MCG/ACT nasal spray Place 1 spray into both nostrils daily. 16 g 3  . nystatin-triamcinolone (MYCOLOG II) cream Apply 1 application topically 2 (two) times daily. 30 g 0  . SYMBICORT  160-4.5 MCG/ACT inhaler Inhale 2 puffs into the lungs 2 (two) times daily. FOR COPD 1 Inhaler 6  . trolamine salicylate (ASPERCREME) 10 % cream Apply 1 application topically as needed for muscle pain.    Marland Kitchen ULTICARE MINI PEN NEEDLES 32G X 6 MM MISC USE ONCE DAILY WITH ADMINISTRATION OF INSULIN DX E11.59 100 each 1  . hydrochlorothiazide (HYDRODIURIL) 12.5 MG tablet TAKE 1 TABLET BY MOUTH EVERY DAY 30 tablet 5  . Insulin Detemir (LEVEMIR FLEXTOUCH) 100 UNIT/ML Pen Inject 30 Units into the skin at bedtime. 15 mL 5  . JARDIANCE 10 MG TABS tablet TAKE 1 TABLET EVERY DAY 30 tablet 2  . pantoprazole (PROTONIX) 20 MG tablet TAKE 1 TABLET (20 MG TOTAL) BY MOUTH DAILY BEFORE BREAKFAST. 30 tablet 2  . amoxicillin-clavulanate (AUGMENTIN) 875-125 MG tablet Take 1 tablet by mouth 2 (two) times daily. (Patient not taking: Reported on 05/29/2019) 14 tablet 0  . ibuprofen (ADVIL) 600 MG tablet Take 1 tablet (600 mg total) by mouth every 8 (eight) hours as needed (with food). (Patient not taking: Reported on 05/29/2019) 21 tablet 0   Facility-Administered Medications Prior to Visit  Medication Dose Route Frequency Provider Last Rate Last Admin  . ipratropium-albuterol (DUONEB) 0.5-2.5 (3) MG/3ML nebulizer solution 3 mL  3 mL Nebulization Q6H Wiley Magan, Charlene Brooke, NP   3 mL at 04/16/18 0946    ROS See HPI  Objective:  BP 122/80   Pulse 73   Temp (!) 97.2 F (36.2 C) (Tympanic)   Ht 5\' 2"  (  1.575 m)   Wt 240 lb 3.2 oz (109 kg)   LMP 03/07/2006 (Approximate)   SpO2 97%   BMI 43.93 kg/m   BP Readings from Last 3 Encounters:  05/29/19 122/80  08/20/18 127/85  05/23/18 126/84    Wt Readings from Last 3 Encounters:  05/29/19 240 lb 3.2 oz (109 kg)  10/11/18 238 lb (108 kg)  08/20/18 237 lb 9.6 oz (107.8 kg)    Physical Exam Constitutional:      Appearance: She is obese.  Cardiovascular:     Rate and Rhythm: Normal rate and regular rhythm.     Pulses: Normal pulses.     Heart sounds: Normal heart  sounds.  Pulmonary:     Effort: Pulmonary effort is normal.     Breath sounds: Normal breath sounds.  Musculoskeletal:        General: Tenderness present. No swelling or deformity.     Right lower leg: No edema.     Left lower leg: No edema.  Skin:    General: Skin is warm and dry.  Neurological:     Mental Status: She is alert and oriented to person, place, and time.  Psychiatric:        Mood and Affect: Mood normal.        Behavior: Behavior normal.        Thought Content: Thought content normal.    Lab Results  Component Value Date   WBC 12.7 (H) 04/23/2018   HGB 15.0 04/23/2018   HCT 45.7 04/23/2018   PLT 290.0 04/23/2018   GLUCOSE 114 (H) 05/29/2019   CHOL 123 08/24/2018   TRIG 128.0 08/24/2018   HDL 36.10 (L) 08/24/2018   LDLCALC 61 08/24/2018   ALT 21 04/18/2018   AST 17 04/18/2018   NA 140 05/29/2019   K 4.7 05/29/2019   CL 102 05/29/2019   CREATININE 0.84 05/29/2019   BUN 12 05/29/2019   CO2 30 05/29/2019   TSH 0.592 04/16/2018   INR 1.0 11/07/2017   HGBA1C 6.5 05/29/2019   MICROALBUR <0.7 05/29/2019    Assessment & Plan:  This visit occurred during the SARS-CoV-2 public health emergency.  Safety protocols were in place, including screening questions prior to the visit, additional usage of staff PPE, and extensive cleaning of exam room while observing appropriate contact time as indicated for disinfecting solutions.   Judy Fischer was seen today for follow-up.  Diagnoses and all orders for this visit:  Type 2 diabetes mellitus without complication, with long-term current use of insulin (HCC) -     Hemoglobin A1c -     Basic metabolic panel -     Microalbumin / creatinine urine ratio -     insulin detemir (LEVEMIR FLEXTOUCH) 100 UNIT/ML FlexPen; Inject 20 Units into the skin at bedtime. -     empagliflozin (JARDIANCE) 10 MG TABS tablet; Take 10 mg by mouth daily.  Hypertension associated with diabetes (HCC) -     Basic metabolic panel -      hydrochlorothiazide (HYDRODIURIL) 12.5 MG tablet; Take 1 tablet (12.5 mg total) by mouth daily.  Chronic pain of right knee -     Ambulatory referral to Sports Medicine  Chronic left shoulder pain -     Ambulatory referral to Sports Medicine  Gastroesophageal reflux disease without esophagitis -     pantoprazole (PROTONIX) 20 MG tablet; Take 1 tablet (20 mg total) by mouth daily before breakfast.   I have discontinued [name] L. Muraoka's amoxicillin-clavulanate and ibuprofen.  I have also changed her Levemir FlexTouch, Jardiance, and hydrochlorothiazide. Additionally, I am having her maintain her aspirin, diphenhydramine-acetaminophen, fluticasone, nystatin-triamcinolone, trolamine salicylate, Albuterol Sulfate, Symbicort, UltiCare Mini Pen Needles, FreeStyle Libre 14 Day Sensor, [employer] 14 Day Reader, and pantoprazole. We will continue to administer ipratropium-albuterol.  Meds ordered this encounter  Medications  . insulin detemir (LEVEMIR FLEXTOUCH) 100 UNIT/ML FlexPen    Sig: Inject 20 Units into the skin at bedtime.    Dispense:  15 mL    Refill:  5    Change in dose    Order Specific Question:   Supervising Provider    Answer:   Overton Mam [[id number]]  . empagliflozin (JARDIANCE) 10 MG TABS tablet    Sig: Take 10 mg by mouth daily.    Dispense:  90 tablet    Refill:  1    Dispense as insurance will pay for    Order Specific Question:   Supervising Provider    Answer:   Overton Mam [[id number]]  . pantoprazole (PROTONIX) 20 MG tablet    Sig: Take 1 tablet (20 mg total) by mouth daily before breakfast.    Dispense:  90 tablet    Refill:  0    Order Specific Question:   Supervising Provider    Answer:   Overton Mam [[id number]]  . hydrochlorothiazide (HYDRODIURIL) 12.5 MG tablet    Sig: Take 1 tablet (12.5 mg total) by mouth daily.    Dispense:  90 tablet    Refill:  1    Order Specific Question:   Supervising Provider    Answer:   Overton Mam  [[id number]]    Problem List Items Addressed This Visit      Cardiovascular and Mediastinum   Hypertension associated with diabetes (HCC)   Relevant Medications   insulin detemir (LEVEMIR FLEXTOUCH) 100 UNIT/ML FlexPen   empagliflozin (JARDIANCE) 10 MG TABS tablet   hydrochlorothiazide (HYDRODIURIL) 12.5 MG tablet   Other Relevant Orders   Basic metabolic panel (Completed)     Digestive   GERD (gastroesophageal reflux disease)   Relevant Medications   pantoprazole (PROTONIX) 20 MG tablet     Endocrine   Type 2 diabetes mellitus without complication, with long-term current use of insulin (HCC) - Primary   Relevant Medications   insulin detemir (LEVEMIR FLEXTOUCH) 100 UNIT/ML FlexPen   empagliflozin (JARDIANCE) 10 MG TABS tablet   Other Relevant Orders   Hemoglobin A1c (Completed)   Basic metabolic panel (Completed)   Microalbumin / creatinine urine ratio (Completed)     Other   Chronic pain of right knee   Relevant Orders   Ambulatory referral to Sports Medicine    Other Visit Diagnoses    Chronic left shoulder pain       Relevant Orders   Ambulatory referral to Sports Medicine      Follow-up: Return in about 3 months (around 08/29/2019) for DM and HTN, hyperlipidemia (fasting).  Alysia Penna, NP

## 2019-05-30 ENCOUNTER — Encounter: Payer: Self-pay | Admitting: Nurse Practitioner

## 2019-05-30 MED ORDER — HYDROCHLOROTHIAZIDE 12.5 MG PO TABS: 12.5000 mg | ORAL_TABLET | Freq: Every day | ORAL | 1 refills | Status: DC

## 2019-05-30 MED ORDER — JARDIANCE 10 MG PO TABS: 10.0000 mg | ORAL_TABLET | Freq: Every day | ORAL | 1 refills | Status: DC

## 2019-05-30 MED ORDER — PANTOPRAZOLE SODIUM 20 MG PO TBEC: 20.0000 mg | DELAYED_RELEASE_TABLET | Freq: Every day | ORAL | 0 refills | Status: DC

## 2019-05-30 MED ORDER — LEVEMIR FLEXTOUCH 100 UNIT/ML ~~LOC~~ SOPN: 20.0000 [IU] | PEN_INJECTOR | Freq: Every day | SUBCUTANEOUS | 5 refills | Status: DC

## 2019-05-30 NOTE — Progress Notes (Signed)
Abstracted result and sent to scan  

## 2019-06-11 ENCOUNTER — Encounter: Payer: Self-pay | Admitting: Family Medicine

## 2019-06-11 ENCOUNTER — Ambulatory Visit: Payer: Self-pay

## 2019-06-11 ENCOUNTER — Ambulatory Visit (INDEPENDENT_AMBULATORY_CARE_PROVIDER_SITE_OTHER): Payer: 59 | Admitting: Family Medicine

## 2019-06-11 ENCOUNTER — Ambulatory Visit (INDEPENDENT_AMBULATORY_CARE_PROVIDER_SITE_OTHER): Payer: 59

## 2019-06-11 ENCOUNTER — Other Ambulatory Visit: Payer: Self-pay

## 2019-06-11 VITALS — BP 110/72 | HR 92 | Ht 62.0 in | Wt 235.8 lb

## 2019-06-11 DIAGNOSIS — G8929 Other chronic pain: Secondary | ICD-10-CM

## 2019-06-11 DIAGNOSIS — M25512 Pain in left shoulder: Secondary | ICD-10-CM

## 2019-06-11 DIAGNOSIS — M25561 Pain in right knee: Secondary | ICD-10-CM

## 2019-06-11 DIAGNOSIS — M79602 Pain in left arm: Secondary | ICD-10-CM

## 2019-06-11 MED ORDER — GABAPENTIN 100 MG PO CAPS: 100.0000 mg | ORAL_CAPSULE | Freq: Three times a day (TID) | ORAL | 3 refills | Status: DC | PRN

## 2019-06-11 NOTE — Patient Instructions (Addendum)
Thank you for coming in today. Try using voltaren gel on the knee and shoulder a bit.  Try gabapentin up to 3x daily. Take it mostly as bedtime as needed because it may make you sleepy.   Recheck soon for right knee injection.  We will do a cortisone shot.   Anytime after the next covid shot.

## 2019-06-11 NOTE — Progress Notes (Signed)
Subjective:    I'm seeing this patient as a consultation for:  Judy Lacy, NP. Note will be routed back to referring provider/PCP.  CC: R knee and L shoulder pain  I, Judy Fischer, LAT, ATC, am serving as scribe for Dr. Lynne Leader.  HPI: Pt is a 59 y/o female presenting w/ c/o R knee and L shoulder pain.  R knee pain: Pain x years that is intermittent in nature but severe when she has it/ -Swelling: Intermittent -Mechanical symptoms: Yes -Aggravating factors: cold air; transitioning from sit to stand; stairs -Treatments tried: Topical rubs -Diagnostic imaging: B knee XR- 03/31/17  L shoulder pain: Pain x 6-8 months w/ no known MOI.  Constant, aching pain -Mechanical symptoms: No -Radiating pain: Yes in L upper arm -Aggravating factors: cold air -Treatments tried: Topical pain relieving cream   Past medical history, Surgical history, Family history, Social history, Allergies, and medications have been entered into the medical record, reviewed.   Review of Systems: No new headache, visual changes, nausea, vomiting, diarrhea, constipation, dizziness, abdominal pain, skin rash, fevers, chills, night sweats, weight loss, swollen lymph nodes, body aches, joint swelling, muscle aches, chest pain, shortness of breath, mood changes, visual or auditory hallucinations.   Objective:    Vitals:   06/11/19 1532  BP: 110/72  Pulse: 92  SpO2: 94%   General: Well Developed, well nourished, and in no acute distress.  Neuro/Psych: Alert and oriented x3, extra-ocular muscles intact, able to move all 4 extremities, sensation grossly intact. Skin: Warm and dry, no rashes noted.  Respiratory: Not using accessory muscles, speaking in full sentences, trachea midline.  Cardiovascular: Pulses palpable, no extremity edema. Abdomen: Does not appear distended. MSK:  C-spine: Normal-appearing nontender normal cervical motion cervical motion does not reproduce pain. Left shoulder:  Normal-appearing Normal motion. Not particularly tender to palpation. Intact strength abduction external and internal rotation. Negative Hawkins and Neer's test.. Left elbow wrist and hand normal-appearing nontender normal motion nontender.  Right knee normal-appearing Motion right 0-120 degrees with crepitation. Diffusely tender mildly. Stable ligamentous exam. Intact strength.  Lab and Radiology Results  X-ray images left humerus and chest obtained today personally independently reviewed.  Left humerus: Accessory ossicle versus old avulsion at insertion of supraspinatus tendon onto the humeral head.  Otherwise humerus normal-appearing  Chest: No significant acute intrathoracic abnormality  Await formal radiology review.  EXAM: RIGHT KNEE - COMPLETE 4+ VIEW  COMPARISON:  02/07/2013  FINDINGS: Osseous mineralization normal.  Medial compartment joint space narrowing and spur formation.  Patellofemoral degenerative changes also present.  Lateral compartment joint space preserved.  No acute fracture, dislocation, or bone destruction.  No knee joint effusion.  IMPRESSION: Osteoarthritic changes RIGHT knee without acute bony abnormalities.   Electronically Signed   By: Lavonia Dana M.D.   On: 03/31/2017 16:14  I, Lynne Leader, personally (independently) visualized and performed the interpretation of the images attached in this note.     Impression and Recommendations:    Assessment and Plan: 59 y.o. female with left upper arm pain.  Etiology is somewhat unclear.  Pain is diffuse ongoing for 6 to 8 months chronic and sore.  Shoulder motion does not significantly reproduce pain.  Pain certainly could be cervical radicular however distribution does not entirely make sense for this.  Distribution more significant for axillary nerve however patient does not have weakness.  Discussed options.  X-rays as above did not show obvious abnormality however radiology  overread is still pending.  Plan for trial  of gabapentin check back in a few weeks.  If not better would consider ultrasound and possibly trial of subacromial injection.  Right knee pain: Patient 2 years ago had significant degenerative changes seen on x-ray.  I think that is the main cause of her pain today.  Offered injection.  Patient would like to wait until after her second COVID-19 vaccine before she proceeds with steroid injection.  Also discussed possibility of hyaluronic acid injection. Recheck back in near future.  Marland Kitchen  PDMP not reviewed this encounter. Orders Placed This Encounter  Procedures  . DG Humerus Left    Standing Status:   Future    Number of Occurrences:   1    Standing Expiration Date:   08/10/2020    Order Specific Question:   Reason for Exam (SYMPTOM  OR DIAGNOSIS REQUIRED)    Answer:   left arm pain    Order Specific Question:   Is patient pregnant?    Answer:   No    Order Specific Question:   Preferred imaging location?    Answer:   [facility]    Order Specific Question:   Radiology Contrast Protocol - do NOT remove file path    Answer:   \\[email or web address]\[email or web address]\DXFluoroContrastProtocols.pdf  . DG Chest 2 View    Standing Status:   Future    Number of Occurrences:   1    Standing Expiration Date:   08/10/2020    Order Specific Question:   Reason for Exam (SYMPTOM  OR DIAGNOSIS REQUIRED)    Answer:   left arm and shoulder pain    Order Specific Question:   Is patient pregnant?    Answer:   No    Order Specific Question:   Preferred imaging location?    Answer:   [facility]    Order Specific Question:   Radiology Contrast Protocol - do NOT remove file path    Answer:   \\[email or web address]\[email or web address]\DXFluoroContrastProtocols.pdf   Meds ordered this encounter  Medications  . gabapentin (NEURONTIN) 100 MG capsule    Sig: Take 1-2 capsules (100-200 mg total) by mouth 3 (three) times daily as needed.    Dispense:  90 capsule    Refill:  3     Discussed warning signs or symptoms. Please see discharge instructions. Patient expresses understanding.   The above documentation has been reviewed and is accurate and complete [name]

## 2019-06-12 NOTE — Progress Notes (Signed)
Chest x-ray normal

## 2019-06-12 NOTE — Progress Notes (Signed)
X-ray humerus shows some shoulder arthritis.  No significant bony abnormalities.  Arm pain is probably coming from the shoulder arthritis and if not better reasonable to try shoulder injection.

## 2019-06-18 ENCOUNTER — Ambulatory Visit: Payer: 59 | Attending: Internal Medicine

## 2019-06-18 DIAGNOSIS — Z23 Encounter for immunization: Secondary | ICD-10-CM

## 2019-06-18 NOTE — Progress Notes (Signed)
   Covid-19 Vaccination Clinic  Name:  Judy Fischer    MRN: [id number] DOB: [birth year]  06/18/2019  [name] was observed post Covid-19 immunization for 15 minutes without incident. She was provided with Vaccine Information Sheet and instruction to access the V-Safe system.   [name] was instructed to call 911 with any severe reactions post vaccine: Marland Kitchen Difficulty breathing  . Swelling of face and throat  . A fast heartbeat  . A bad rash all over body  . Dizziness and weakness   Immunizations Administered    Name Date Dose VIS Date Route   Pfizer COVID-19 Vaccine 06/18/2019 11:49 AM 0.3 mL 02/15/2019 Intramuscular   Manufacturer: [employer], [employer]   Lot: [id number]   NDC: [address]-7

## 2019-07-09 ENCOUNTER — Encounter: Payer: Self-pay | Admitting: Family Medicine

## 2019-07-09 ENCOUNTER — Ambulatory Visit: Payer: Self-pay

## 2019-07-09 ENCOUNTER — Other Ambulatory Visit: Payer: Self-pay

## 2019-07-09 ENCOUNTER — Ambulatory Visit (INDEPENDENT_AMBULATORY_CARE_PROVIDER_SITE_OTHER): Payer: 59 | Admitting: Family Medicine

## 2019-07-09 VITALS — BP 118/76 | HR 86 | Ht 62.0 in | Wt 237.0 lb

## 2019-07-09 DIAGNOSIS — M25561 Pain in right knee: Secondary | ICD-10-CM

## 2019-07-09 DIAGNOSIS — M25512 Pain in left shoulder: Secondary | ICD-10-CM | POA: Diagnosis not present

## 2019-07-09 NOTE — Progress Notes (Signed)
Judy Fischer is a 59 y.o. female who presents to [facility] at [facility] today for f/u of R knee and L shoulder/upper arm pain.  She was last seen by Dr. Denyse Amass on 06/11/19 for both of these issues and was prescribed Gabapentin and advised to use Voltaren gel.  Since her last visit, pt reports that her L arm is worse and the R knee is unchanged from her last visit.  She is not taking the Gabapentin due to nausea after taking it for 4 days.  She states that she hasn't used the Voltaren gel due to being nervous about the side effects.  Diagnostic imaging:  B knee XR- 03/31/17 and L humerus XR- 06/11/19  Pertinent review of systems: No fevers or chills  Relevant historical information: Hypertension and diabetes   Exam:  BP 118/76 (BP Location: Right Arm, Patient Position: Sitting, Cuff Size: Large)   Pulse 86   Ht 5\' 2"  (1.575 m)   Wt 237 lb (107.5 kg)   LMP 03/07/2006 (Approximate)   SpO2 94%   BMI 43.35 kg/m  General: Well Developed, well nourished, and in no acute distress.   MSK: Left shoulder normal-appearing normal motion not particular tender palpation.  Right knee no significant joint effusion otherwise normal-appearing Normal motion and strength. Stable ligamentous exam.    Lab and Radiology Results DG Chest 2 View  Result Date: 06/12/2019 CLINICAL DATA:  Chest pain and left upper arm pain for 6 months. EXAM: CHEST - 2 VIEW COMPARISON:  Chest x-rays dated 04/16/2018 and 05/12/2017 FINDINGS: The heart size and mediastinal contours are within normal limits. Both lungs are clear. The visualized skeletal structures are unremarkable. IMPRESSION: Normal exam. Electronically Signed   By: 07/12/2017 M.D.   On: 06/12/2019 08:08   DG Humerus Left  Result Date: 06/12/2019 CLINICAL DATA:  Left arm pain EXAM: LEFT HUMERUS - 2+ VIEW COMPARISON:  None. FINDINGS: No fracture or dislocation. A tiny calcifications seen the posterior aspect of the humeral head.  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 AC joint arthrosis seen with joint space loss marginal osteophyte formation. IMPRESSION: No acute osseous abnormality. Electronically Signed   By: 08/12/2019 M.D.   On: 06/12/2019 08:14   I, 08/12/2019, personally (independently) visualized and performed the interpretation of the images attached in this note.  Procedure: Real-time Ultrasound Guided Injection of left shoulder glenohumeral joint Device: Philips Affiniti 50G Images permanently stored and available for review in the ultrasound unit. Verbal informed consent obtained.  Discussed risks and benefits of procedure. Warned about infection bleeding damage to structures skin hypopigmentation and fat atrophy among others. Patient expresses understanding and agreement Time-out conducted.   Noted no overlying erythema, induration, or other signs of local infection.   Skin prepped in a sterile fashion.   Local anesthesia: Topical Ethyl chloride.   With sterile technique and under real time ultrasound guidance:  40 mg of Kenalog and 3 mL of Marcaine injected easily.   Completed without difficulty   Pain immediately partially resolved suggesting accurate placement of the medication.   Advised to call if fevers/chills, erythema, induration, drainage, or persistent bleeding.   Images permanently stored and available for review in the ultrasound unit.  Impression: Technically successful ultrasound guided injection.  Procedure: Real-time Ultrasound Guided Injection of right knee intra-articular superior patellar space Device: Philips Affiniti 50G Images permanently stored and available for review in the ultrasound unit. Verbal informed consent obtained.  Discussed risks  and benefits of procedure. Warned about infection bleeding damage to structures skin hypopigmentation and fat atrophy among others. Patient expresses understanding and agreement Time-out conducted.   Noted no  overlying erythema, induration, or other signs of local infection.   Skin prepped in a sterile fashion.   Local anesthesia: Topical Ethyl chloride.   With sterile technique and under real time ultrasound guidance:  40 mg of Kenalog and 3 mL of Marcaine injected easily.   Completed without difficulty   Pain immediately resolved suggesting accurate placement of the medication.   Advised to call if fevers/chills, erythema, induration, drainage, or persistent bleeding.   Images permanently stored and available for review in the ultrasound unit.  Impression: Technically successful ultrasound guided injection.           Assessment and Plan: 59 y.o. female with left shoulder pain and right knee pain.  Left shoulder pain.  Exact cause of pain is somewhat unclear at this time.  Patient's pain is not characteristic of typical shoulder pain nor of typical radicular pain from cervical spine.  She was thought to perhaps have an axillary nerve issue at the last visit.  We discussed her options at this visit and proceeded with a intra-articular glenohumeral injection as a diagnostic tool and potentially therapeutic tool.  Fortunately she had great response to the intra-articular injection.  This means that her pain is likely mostly coming from her glenohumeral joint.  Hopeful that she will have ongoing benefit from the steroid component going forward. Recheck in 1 month or so if needed.  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  Patient had good benefit from steroid injection right knee as well.  Hopeful that that will provide better pain relief going forward as well.  Recheck in 1 month.    Orders Placed This Encounter  Procedures  . [place] LIMITED JOINT SPACE STRUCTURES LOW RIGHT(NO LINKED CHARGES)    Order Specific Question:   Reason for Exam (SYMPTOM  OR DIAGNOSIS REQUIRED)    Answer:   R knee pain    Order Specific Question:   Preferred imaging location?    Answer:   [facility]   No orders of the defined types were placed in this encounter.    Discussed warning signs or symptoms. Please see discharge instructions. Patient expresses understanding.   The above documentation has been reviewed and is accurate and complete Lynne Leader

## 2019-07-09 NOTE — Patient Instructions (Addendum)
You had a R knee and a L shoulder injection today. Call or go to the ER if you develop a large red swollen joint with extreme pain or oozing puss.   Thank you for coming in today. Pay attention to the pain in your knee and shoulder.   Recheck with me as needed or in about 1 month.

## 2019-07-20 ENCOUNTER — Other Ambulatory Visit: Payer: Self-pay | Admitting: Nurse Practitioner

## 2019-07-20 DIAGNOSIS — E119 Type 2 diabetes mellitus without complications: Secondary | ICD-10-CM

## 2019-11-22 ENCOUNTER — Other Ambulatory Visit: Payer: Self-pay | Admitting: Nurse Practitioner

## 2019-12-04 ENCOUNTER — Telehealth: Payer: Self-pay | Admitting: Nurse Practitioner

## 2019-12-04 NOTE — Telephone Encounter (Signed)
LVM for patient to return call. 

## 2019-12-04 NOTE — Telephone Encounter (Signed)
Patient is calling and wanting to speak to someone regarding medications and wanted to see if Claris Gower recommends her to get the shingles vaccine, please advise. CB is [phone].

## 2019-12-10 NOTE — Telephone Encounter (Signed)
Multiple voicemails left for patient to return call with no call back, closing encounter.

## 2020-01-04 ENCOUNTER — Ambulatory Visit: Payer: 59 | Attending: Internal Medicine

## 2020-01-04 DIAGNOSIS — Z23 Encounter for immunization: Secondary | ICD-10-CM

## 2020-01-04 NOTE — Progress Notes (Signed)
   Covid-19 Vaccination Clinic  Name:  GERALDIN HABERMEHL    MRN: [id number] DOB: [birth year]  01/04/2020  Ms. Swander was observed post Covid-19 immunization for 15 minutes without incident. She was provided with Vaccine Information Sheet and instruction to access the V-Safe system.   Ms. Andy was instructed to call 911 with any severe reactions post vaccine: Marland Kitchen Difficulty breathing  . Swelling of face and throat  . A fast heartbeat  . A bad rash all over body  . Dizziness and weakness

## 2020-03-31 NOTE — Progress Notes (Addendum)
I, Christoper Fabian, LAT, ATC, am serving as scribe for Dr. [name].  Judy Fischer is a 60 y.o. female who presents to [facility] at [facility] today for f/u of R knee pain.  She was last seen by Dr. Denyse Amass on 07/09/19 for R knee and L shoulder pain and had injections in both of these joints.  Since her last visit, pt reports R knee has gotten much worse. Pt locates pain to medial aspect of R knee and describes pain as constant. Pt notes swelling of the R knee and reports sometimes pain will wake her up in the middle of the night and radiate up into her R groin.  She thinks the last steroid injection to the right knee lasted about 2 months.  Mechanical symptoms: yes Radiates: yes- into groin Aggravates: cold weather, sitting for long periods Rx tried: Tylenol  Diagnostic imaging: R and L knee XR- 03/31/17  Pertinent review of systems: No fevers or chills  Relevant historical information: Hypertension and diabetes.  COPD.   Exam:  BP 130/84 (BP Location: Right Arm, Patient Position: Sitting, Cuff Size: Large)   Pulse 78   Ht 5\' 2"  (1.575 m)   Wt 243 lb 3.2 oz (110.3 kg)   LMP 03/07/2006 (Approximate)   SpO2 96%   BMI 44.48 kg/m  General: Well Developed, well nourished, and in no acute distress.   MSK: Right knee moderate effusion otherwise normal-appearing Normal motion with crepitation. Tender palpation medial joint line. Stable ligamentous exam. Intact strength.  Right hip normal-appearing normal motion without pain.  Lab and Radiology Results  Procedure: Real-time Ultrasound Guided Injection of right knee superior lateral patellar space. Device: Philips Affiniti 50G Images permanently stored and available for review in PACS Ultrasound examination prior to injection reveals moderate effusion.  Narrowed medial joint line with partially extruded medial meniscus.  No Baker's cyst. Verbal informed consent obtained.  Discussed risks and benefits of  procedure. Warned about infection bleeding damage to structures skin hypopigmentation and fat atrophy among others. Patient expresses understanding and agreement Time-out conducted.   Noted no overlying erythema, induration, or other signs of local infection.   Skin prepped in a sterile fashion.   Local anesthesia: Topical Ethyl chloride.   With sterile technique and under real time ultrasound guidance:  40 mg of Kenalog and 2 mL of Marcaine injected into knee joint. Fluid seen entering the joint capsule.   Completed without difficulty   Pain immediately resolved suggesting accurate placement of the medication.   Advised to call if fevers/chills, erythema, induration, drainage, or persistent bleeding.   Images permanently stored and available for review in the ultrasound unit.  Impression: Technically successful ultrasound guided injection.   X-ray images right knee obtained today personally and independently interpreted. Moderate medial compartment DJD.  Mild patellofemoral DJD. Await formal radiology review    Assessment and Plan: 60 y.o. female with right knee pain due to medial compartment DJD.  Plan for repeat steroid injection today.  Work on hyaluronic acid authorization.  Recommend Voltaren gel quad strengthening and weight loss as well.  Recheck back as needed.   PDMP not reviewed this encounter. Orders Placed This Encounter  Procedures  . 41 LIMITED JOINT SPACE STRUCTURES LOW RIGHT(NO LINKED CHARGES)    Standing Status:   Future    Number of Occurrences:   1    Standing Expiration Date:   09/29/2020    Order Specific Question:   Reason for Exam (SYMPTOM  OR  DIAGNOSIS REQUIRED)    Answer:   chronic right knee pain    Order Specific Question:   Preferred imaging location?    Answer:   [facility]  . DG Knee AP/LAT W/Sunrise Right    Standing Status:   Future    Standing Expiration Date:   04/01/2021    Order Specific Question:   Reason for Exam  (SYMPTOM  OR DIAGNOSIS REQUIRED)    Answer:   eval knee pain    Order Specific Question:   Is patient pregnant?    Answer:   No    Order Specific Question:   Preferred imaging location?    Answer:   [facility]   No orders of the defined types were placed in this encounter.    Discussed warning signs or symptoms. Please see discharge instructions. Patient expresses understanding.   The above documentation has been reviewed and is accurate and complete [name], M.D.

## 2020-04-01 ENCOUNTER — Ambulatory Visit (INDEPENDENT_AMBULATORY_CARE_PROVIDER_SITE_OTHER): Payer: 59 | Admitting: Family Medicine

## 2020-04-01 ENCOUNTER — Ambulatory Visit: Payer: Self-pay

## 2020-04-01 ENCOUNTER — Other Ambulatory Visit: Payer: Self-pay

## 2020-04-01 ENCOUNTER — Ambulatory Visit (INDEPENDENT_AMBULATORY_CARE_PROVIDER_SITE_OTHER): Payer: 59

## 2020-04-01 VITALS — BP 130/84 | HR 78 | Ht 62.0 in | Wt 243.2 lb

## 2020-04-01 DIAGNOSIS — G8929 Other chronic pain: Secondary | ICD-10-CM | POA: Diagnosis not present

## 2020-04-01 DIAGNOSIS — M25561 Pain in right knee: Secondary | ICD-10-CM

## 2020-04-01 NOTE — Progress Notes (Signed)
X-ray shows arthritis in the knee. It looks about the same to maybe a little bit worse compared to prior x-ray. I think the gel shots will be helpful in the future if the cortisone shot does not last.

## 2020-04-01 NOTE — Patient Instructions (Addendum)
Thank you for coming in today.  Please get an Xray today before you leave  Call or go to the ER if you develop a large red swollen joint with extreme pain or oozing puss.   I will work on gel shot authorization. If you want to do that in the future give me a week's warning.   Please use voltaren gel up to 4x daily for pain as needed.

## 2020-04-08 ENCOUNTER — Telehealth: Payer: Self-pay

## 2020-04-08 NOTE — Telephone Encounter (Signed)
Outcome Approvedon January 20 CaseId:[id number];Status:Approved;Review Type:Prior Auth;Coverage Start Date:02/25/2020;Coverage End Date:03/26/2021; Drug Jardiance 10MG  tablets Form [employer] Electronic PA Form [phone] NCPDP)

## 2020-04-21 ENCOUNTER — Telehealth: Payer: Self-pay | Admitting: Nurse Practitioner

## 2020-04-21 DIAGNOSIS — I152 Hypertension secondary to endocrine disorders: Secondary | ICD-10-CM

## 2020-04-21 NOTE — Telephone Encounter (Signed)
Patient states she will call back and schedule an appointment

## 2020-05-19 ENCOUNTER — Other Ambulatory Visit: Payer: Self-pay | Admitting: Nurse Practitioner

## 2020-05-19 DIAGNOSIS — E1159 Type 2 diabetes mellitus with other circulatory complications: Secondary | ICD-10-CM

## 2020-05-19 DIAGNOSIS — I152 Hypertension secondary to endocrine disorders: Secondary | ICD-10-CM

## 2020-06-08 ENCOUNTER — Encounter: Payer: Self-pay | Admitting: Nurse Practitioner

## 2020-06-08 ENCOUNTER — Ambulatory Visit (INDEPENDENT_AMBULATORY_CARE_PROVIDER_SITE_OTHER): Payer: 59 | Admitting: Nurse Practitioner

## 2020-06-08 ENCOUNTER — Other Ambulatory Visit: Payer: Self-pay

## 2020-06-08 VITALS — BP 136/88 | HR 82 | Temp 97.7°F | Ht 62.5 in | Wt 242.2 lb

## 2020-06-08 DIAGNOSIS — E119 Type 2 diabetes mellitus without complications: Secondary | ICD-10-CM | POA: Diagnosis not present

## 2020-06-08 DIAGNOSIS — E1159 Type 2 diabetes mellitus with other circulatory complications: Secondary | ICD-10-CM

## 2020-06-08 DIAGNOSIS — Z136 Encounter for screening for cardiovascular disorders: Secondary | ICD-10-CM

## 2020-06-08 DIAGNOSIS — Z794 Long term (current) use of insulin: Secondary | ICD-10-CM | POA: Diagnosis not present

## 2020-06-08 DIAGNOSIS — B3741 Candidal cystitis and urethritis: Secondary | ICD-10-CM

## 2020-06-08 DIAGNOSIS — Z13220 Encounter for screening for lipoid disorders: Secondary | ICD-10-CM | POA: Diagnosis not present

## 2020-06-08 DIAGNOSIS — Z720 Tobacco use: Secondary | ICD-10-CM

## 2020-06-08 DIAGNOSIS — Z90721 Acquired absence of ovaries, unilateral: Secondary | ICD-10-CM | POA: Insufficient documentation

## 2020-06-08 DIAGNOSIS — L409 Psoriasis, unspecified: Secondary | ICD-10-CM

## 2020-06-08 DIAGNOSIS — Z90710 Acquired absence of both cervix and uterus: Secondary | ICD-10-CM | POA: Insufficient documentation

## 2020-06-08 DIAGNOSIS — I152 Hypertension secondary to endocrine disorders: Secondary | ICD-10-CM

## 2020-06-08 DIAGNOSIS — J441 Chronic obstructive pulmonary disease with (acute) exacerbation: Secondary | ICD-10-CM

## 2020-06-08 DIAGNOSIS — K219 Gastro-esophageal reflux disease without esophagitis: Secondary | ICD-10-CM

## 2020-06-08 LAB — COMPREHENSIVE METABOLIC PANEL
ALT: 12 U/L (ref 0–35)
AST: 8 U/L (ref 0–37)
Albumin: 4.2 g/dL (ref 3.5–5.2)
Alkaline Phosphatase: 74 U/L (ref 39–117)
BUN: 9 mg/dL (ref 6–23)
CO2: 32 mEq/L (ref 19–32)
Calcium: 9.3 mg/dL (ref 8.4–10.5)
Chloride: 103 mEq/L (ref 96–112)
Creatinine, Ser: 0.73 mg/dL (ref 0.40–1.20)
GFR: 90.72 mL/min (ref >60.00)
Glucose, Bld: 134 mg/dL — ABNORMAL HIGH (ref 70–99)
Potassium: 4.7 mEq/L (ref 3.5–5.1)
Sodium: 141 mEq/L (ref 135–145)
Total Bilirubin: 0.6 mg/dL (ref 0.2–1.2)
Total Protein: 6.7 g/dL (ref 6.0–8.3)

## 2020-06-08 LAB — LIPID PANEL
Cholesterol: 133 mg/dL (ref 0–200)
HDL: 46.6 mg/dL (ref >39.00)
LDL Cholesterol: 71 mg/dL (ref 0–99)
NonHDL: 86.13
Total CHOL/HDL Ratio: 3
Triglycerides: 75 mg/dL (ref 0.0–149.0)
VLDL: 15 mg/dL (ref 0.0–40.0)

## 2020-06-08 LAB — MICROALBUMIN / CREATININE URINE RATIO
Creatinine,U: 177.7 mg/dL
Microalb Creat Ratio: 0.6 mg/g (ref 0.0–30.0)
Microalb, Ur: 1 mg/dL (ref 0.0–1.9)

## 2020-06-08 LAB — HEMOGLOBIN A1C: Hgb A1c MFr Bld: 7.7 % — ABNORMAL HIGH (ref 4.6–6.5)

## 2020-06-08 MED ORDER — NYSTATIN-TRIAMCINOLONE 100000-0.1 UNIT/GM-% EX CREA: 1.0000 "application " | TOPICAL_CREAM | Freq: Two times a day (BID) | CUTANEOUS | 2 refills | Status: DC

## 2020-06-08 MED ORDER — SYMBICORT 160-4.5 MCG/ACT IN AERO
2.0000 | INHALATION_SPRAY | Freq: Two times a day (BID) | RESPIRATORY_TRACT | 5 refills | Status: AC
Start: 2020-06-08 — End: ?

## 2020-06-08 NOTE — Assessment & Plan Note (Signed)
Continues to smoke 1ppd. No intention on quitting at this time

## 2020-06-08 NOTE — Patient Instructions (Addendum)
Your prescription of hydrochlorothiazide was not affected by the pfizer recall.  Go to lab for urine collection and blood draw  Quit tobacco use.  Have eye exam report faxed to me once completed.  Exercising to Stay Healthy To become healthy and stay healthy, it is recommended that you do moderate-intensity and vigorous-intensity exercise. You can tell that you are exercising at a moderate intensity if your heart starts beating faster and you start breathing faster but can still hold a conversation. You can tell that you are exercising at a vigorous intensity if you are breathing much harder and faster and cannot hold a conversation while exercising. Exercising regularly is important. It has many health benefits, such as:  Improving overall fitness, flexibility, and endurance.  Increasing bone density.  Helping with weight control.  Decreasing body fat.  Increasing muscle strength.  Reducing stress and tension.  Improving overall health. How often should I exercise? Choose an activity that you enjoy, and set realistic goals. Your health care provider can help you make an activity plan that works for you. Exercise regularly as told by your health care provider. This may include:  Doing strength training two times a week, such as: ? Lifting weights. ? Using resistance bands. ? Push-ups. ? Sit-ups. ? Yoga.  Doing a certain intensity of exercise for a given amount of time. Choose from these options: ? A total of 150 minutes of moderate-intensity exercise every week. ? A total of 75 minutes of vigorous-intensity exercise every week. ? A mix of moderate-intensity and vigorous-intensity exercise every week. Children, pregnant women, people who have not exercised regularly, people who are overweight, and older adults may need to talk with a health care provider about what activities are safe to do. If you have a medical condition, be sure to talk with your health care provider before  you start a new exercise program. What are some exercise ideas? Moderate-intensity exercise ideas include:  Walking 1 mile (1.6 km) in about 15 minutes.  Biking.  Hiking.  Golfing.  Dancing.  Water aerobics. Vigorous-intensity exercise ideas include:  Walking 4.5 miles (7.2 km) or more in about 1 hour.  Jogging or running 5 miles (8 km) in about 1 hour.  Biking 10 miles (16.1 km) or more in about 1 hour.  Lap swimming.  Roller-skating or in-line skating.  Cross-country skiing.  Vigorous competitive sports, such as football, basketball, and soccer.  Jumping rope.  Aerobic dancing.   What are some everyday activities that can help me to get exercise?  Yard work, such as: ? Pushing a Surveyor, mininglawn mower. ? Raking and bagging leaves.  Washing your car.  Pushing a stroller.  Shoveling snow.  Gardening.  Washing windows or floors. How can I be more active in my day-to-day activities?  Use stairs instead of an elevator.  Take a walk during your lunch break.  If you drive, park your car farther away from your work or school.  If you take public transportation, get off one stop early and walk the rest of the way.  Stand up or walk around during all of your indoor phone calls.  Get up, stretch, and walk around every 30 minutes throughout the day.  Enjoy exercise with a friend. Support to continue exercising will help you keep a regular routine of activity. What guidelines can I follow while exercising?  Before you start a new exercise program, talk with your health care provider.  Do not exercise so much that you hurt yourself,  feel dizzy, or get very short of breath.  Wear comfortable clothes and wear shoes with good support.  Drink plenty of water while you exercise to prevent dehydration or heat stroke.  Work out until your breathing and your heartbeat get faster. Where to find more information  [place] Department of Health and Human Services:  ThisPath.fi  Centers for Disease Control and Prevention (CDC): FootballExhibition.com.br Summary  Exercising regularly is important. It will improve your overall fitness, flexibility, and endurance.  Regular exercise also will improve your overall health. It can help you control your weight, reduce stress, and improve your bone density.  Do not exercise so much that you hurt yourself, feel dizzy, or get very short of breath.  Before you start a new exercise program, talk with your health care provider. This information is not intended to replace advice given to you by your health care provider. Make sure you discuss any questions you have with your health care provider. Document Revised: 02/03/2017 Document Reviewed: 01/12/2017 Elsevier Patient Education  2021 Elsevier Inc.  Calorie Counting for Judy Fischer Calories are units of energy. Your body needs a certain number of calories from food to keep going throughout the day. When you eat or drink more calories than your body needs, your body stores the extra calories mostly as fat. When you eat or drink fewer calories than your body needs, your body burns fat to get the energy it needs. Calorie counting means keeping track of how many calories you eat and drink each day. Calorie counting can be helpful if you need to lose weight. If you eat fewer calories than your body needs, you should lose weight. Ask your health care provider what a healthy weight is for you. For calorie counting to work, you will need to eat the right number of calories each day to lose a healthy amount of weight per week. A dietitian can help you figure out how many calories you need in a day and will suggest ways to reach your calorie goal.  A healthy amount of weight to lose each week is usually 1-2 lb (0.5-0.9 kg). This usually means that your daily calorie intake should be reduced by 500-750 calories.  Eating 1,200-1,500 calories a day can help most women lose weight.  Eating  1,500-1,800 calories a day can help most men lose weight. What do I need to know about calorie counting? Work with your health care provider or dietitian to determine how many calories you should get each day. To meet your daily calorie goal, you will need to:  Find out how many calories are in each food that you would like to eat. Try to do this before you eat.  Decide how much of the food you plan to eat.  Keep a food log. Do this by writing down what you ate and how many calories it had. To successfully lose weight, it is important to balance calorie counting with a healthy lifestyle that includes regular activity. Where do I find calorie information? The number of calories in a food can be found on a Nutrition Facts label. If a food does not have a Nutrition Facts label, try to look up the calories online or ask your dietitian for help. Remember that calories are listed per serving. If you choose to have more than one serving of a food, you will have to multiply the calories per serving by the number of servings you plan to eat. For example, the label on a package  of bread might say that a serving size is 1 slice and that there are 90 calories in a serving. If you eat 1 slice, you will have eaten 90 calories. If you eat 2 slices, you will have eaten 180 calories.   How do I keep a food log? After each time that you eat, record the following in your food log as soon as possible:  What you ate. Be sure to include toppings, sauces, and other extras on the food.  How much you ate. This can be measured in cups, ounces, or number of items.  How many calories were in each food and drink.  The total number of calories in the food you ate. Keep your food log near you, such as in a pocket-sized notebook or on an app or website on your mobile phone. Some programs will calculate calories for you and show you how many calories you have left to meet your daily goal. What are some portion-control  tips?  Know how many calories are in a serving. This will help you know how many servings you can have of a certain food.  Use a measuring cup to measure serving sizes. You could also try weighing out portions on a kitchen scale. With time, you will be able to estimate serving sizes for some foods.  Take time to put servings of different foods on your favorite plates or in your favorite bowls and cups so you know what a serving looks like.  Try not to eat straight from a food's packaging, such as from a bag or box. Eating straight from the package makes it hard to see how much you are eating and can lead to overeating. Put the amount you would like to eat in a cup or on a plate to make sure you are eating the right portion.  Use smaller plates, glasses, and bowls for smaller portions and to prevent overeating.  Try not to multitask. For example, avoid watching TV or using your computer while eating. If it is time to eat, sit down at a table and enjoy your food. This will help you recognize when you are full. It will also help you be more mindful of what and how much you are eating. What are tips for following this plan? Reading food labels  Check the calorie count compared with the serving size. The serving size may be smaller than what you are used to eating.  Check the source of the calories. Try to choose foods that are high in protein, fiber, and vitamins, and low in saturated fat, trans fat, and sodium. Shopping  Read nutrition labels while you shop. This will help you make healthy decisions about which foods to buy.  Pay attention to nutrition labels for low-fat or fat-free foods. These foods sometimes have the same number of calories or more calories than the full-fat versions. They also often have added sugar, starch, or salt to make up for flavor that was removed with the fat.  Make a grocery list of lower-calorie foods and stick to it. Cooking  Try to cook your favorite foods in  a healthier way. For example, try baking instead of frying.  Use low-fat dairy products. Meal planning  Use more fruits and vegetables. One-half of your plate should be fruits and vegetables.  Include lean proteins, such as chicken, [place], and fish. Lifestyle Each week, aim to do one of the following:  150 minutes of moderate exercise, such as walking.  75 minutes  of vigorous exercise, such as running. General information  Know how many calories are in the foods you eat most often. This will help you calculate calorie counts faster.  Find a way of tracking calories that works for you. Get creative. Try different apps or programs if writing down calories does not work for you. What foods should I eat?  Eat nutritious foods. It is better to have a nutritious, high-calorie food, such as an avocado, than a food with few nutrients, such as a bag of potato chips.  Use your calories on foods and drinks that will fill you up and will not leave you hungry soon after eating. ? Examples of foods that fill you up are nuts and nut butters, vegetables, lean proteins, and high-fiber foods such as whole grains. High-fiber foods are foods with more than 5 g of fiber per serving.  Pay attention to calories in drinks. Low-calorie drinks include water and unsweetened drinks. The items listed above may not be a complete list of foods and beverages you can eat. Contact a dietitian for more information.   What foods should I limit? Limit foods or drinks that are not good sources of vitamins, minerals, or protein or that are high in unhealthy fats. These include:  Candy.  Other sweets.  Sodas, specialty coffee drinks, alcohol, and juice. The items listed above may not be a complete list of foods and beverages you should avoid. Contact a dietitian for more information. How do I count calories when eating out?  Pay attention to portions. Often, portions are much larger when eating out. Try these tips  to keep portions smaller: ? Consider sharing a meal instead of getting your own. ? If you get your own meal, eat only half of it. Before you start eating, ask for a container and put half of your meal into it. ? When available, consider ordering smaller portions from the menu instead of full portions.  Pay attention to your food and drink choices. Knowing the way food is cooked and what is included with the meal can help you eat fewer calories. ? If calories are listed on the menu, choose the lower-calorie options. ? Choose dishes that include vegetables, fruits, whole grains, low-fat dairy products, and lean proteins. ? Choose items that are boiled, broiled, grilled, or steamed. Avoid items that are buttered, battered, fried, or served with cream sauce. Items labeled as crispy are usually fried, unless stated otherwise. ? Choose water, low-fat milk, unsweetened iced tea, or other drinks without added sugar. If you want an alcoholic beverage, choose a lower-calorie option, such as a glass of wine or light beer. ? Ask for dressings, sauces, and syrups on the side. These are usually high in calories, so you should limit the amount you eat. ? If you want a salad, choose a garden salad and ask for grilled meats. Avoid extra toppings such as bacon, cheese, or fried items. Ask for the dressing on the side, or ask for olive oil and vinegar or lemon to use as dressing.  Estimate how many servings of a food you are given. Knowing serving sizes will help you be aware of how much food you are eating at restaurants. Where to find more information  Centers for Disease Control and Prevention: FootballExhibition.com.br  [place] Department of Agriculture: WrestlingReporter.dk Summary  Calorie counting means keeping track of how many calories you eat and drink each day. If you eat fewer calories than your body needs, you should lose weight.  A healthy amount of weight to lose per week is usually 1-2 lb (0.5-0.9 kg). This usually means  reducing your daily calorie intake by 500-750 calories.  The number of calories in a food can be found on a Nutrition Facts label. If a food does not have a Nutrition Facts label, try to look up the calories online or ask your dietitian for help.  Use smaller plates, glasses, and bowls for smaller portions and to prevent overeating.  Use your calories on foods and drinks that will fill you up and not leave you hungry shortly after a meal. This information is not intended to replace advice given to you by your health care provider. Make sure you discuss any questions you have with your health care provider. Document Revised: 04/04/2019 Document Reviewed: 04/04/2019 Elsevier Patient Education  2021 Judy Fischer.

## 2020-06-08 NOTE — Progress Notes (Signed)
Subjective:  Patient ID: Judy Fischer, female    DOB: 12-19-1960  Age: 60 y.o. MRN: [id number]  CC: Follow-up (1 year f/u on HTN and DM. Pt states she would like to discuss the use of her hydrochlorithazide and would like a refill on a cream for her skin. )  HPI  Continuous tobacco abuse Continues to smoke 1ppd. No intention on quitting at this time  Hypertension associated with diabetes (HCC) BP at goal Does not take HCTZ as precribed BP Readings from Last 3 Encounters:  06/08/20 136/88  04/01/20 130/84  07/09/19 118/76   Repeat CMP: stable resume HCTZ 12.5mg  Advised about the importance of medication, diet and exercise compliance.  COPD (chronic obstructive pulmonary disease) (HCC) Resume use of symbicort daily and albuterol prn Advised about the importance of tobacco cessation  Type 2 diabetes mellitus without complication, with long-term current use of insulin (HCC) Does not take jardiance and levemir as prescribed Glucose check 3x/week: 140s-150s per patient. No neuropathy, no vaginal itching/discharge, no urinary symptoms. Drinks 4-16oz bottles of water per day. Has upcoming eye exam Normal foot exam today  Repeat HgbA1c, CMP, lipid panel, urinalysis and urine microalbumin today. HgbA1c at 7.7: resume jardiance 10mg  and levemir 10uints LDL at goal Normal urine microalbumin F/up in 45months  Wt Readings from Last 3 Encounters:  06/08/20 242 lb 3.2 oz (109.9 kg)  04/01/20 243 lb 3.2 oz (110.3 kg)  07/09/19 237 lb (107.5 kg)   BP Readings from Last 3 Encounters:  06/08/20 136/88  04/01/20 130/84  07/09/19 118/76   Reviewed past Medical, Social and Family history today.  Outpatient Medications Prior to Visit  Medication Sig Dispense Refill  . Albuterol Sulfate (PROAIR RESPICLICK) 108 (90 Base) MCG/ACT AEPB Inhale 1 puff into the lungs every 6 (six) hours as needed. 1 each 1  . aspirin 81 MG tablet Take 81 mg by mouth daily.    . Continuous Blood Gluc  Receiver (FREESTYLE LIBRE 14 DAY READER) DEVI 1 Units by Does not apply route once a week. 1 Device 0  . Continuous Blood Gluc Sensor (FREESTYLE LIBRE 14 DAY SENSOR) MISC USE EVERY 14 DAYS AS DIRECTED 1 each 5  . diphenhydramine-acetaminophen (TYLENOL PM) 25-500 MG TABS tablet Take 1 tablet by mouth at bedtime as needed (sleep).     . fluticasone (FLONASE) 50 MCG/ACT nasal spray Place 1 spray into both nostrils daily. 16 g 3  . Insulin Pen Needle (B-D ULTRAFINE III SHORT PEN) 31G X 8 MM MISC 1 Units by Other route at bedtime. 90 each 3  . trolamine salicylate (ASPERCREME) 10 % cream Apply 1 application topically as needed for muscle pain.    09/08/19 empagliflozin (JARDIANCE) 10 MG TABS tablet Take 10 mg by mouth daily. 90 tablet 1  . hydrochlorothiazide (HYDRODIURIL) 12.5 MG tablet TAKE 1 TABLET BY MOUTH EVERY DAY 30 tablet 0  . insulin detemir (LEVEMIR FLEXTOUCH) 100 UNIT/ML FlexPen Inject 20 Units into the skin at bedtime. 15 mL 5  . nystatin-triamcinolone (MYCOLOG II) cream Apply 1 application topically 2 (two) times daily. 30 g 0  . pantoprazole (PROTONIX) 20 MG tablet Take 1 tablet (20 mg total) by mouth daily before breakfast. 90 tablet 0  . SYMBICORT 160-4.5 MCG/ACT inhaler Inhale 2 puffs into the lungs 2 (two) times daily. FOR COPD 1 Inhaler 6  . gabapentin (NEURONTIN) 100 MG capsule Take 1-2 capsules (100-200 mg total) by mouth 3 (three) times daily as needed. (Patient not taking: Reported on 06/08/2020) 90  capsule 3   Facility-Administered Medications Prior to Visit  Medication Dose Route Frequency Provider Last Rate Last Admin  . ipratropium-albuterol (DUONEB) 0.5-2.5 (3) MG/3ML nebulizer solution 3 mL  3 mL Nebulization Q6H Keighan Amezcua, Bonna Gains, NP   3 mL at 04/16/18 0946    ROS See HPI  Objective:  BP 136/88 (BP Location: Left Arm, Patient Position: Sitting, Cuff Size: Large)   Pulse 82   Temp 97.7 F (36.5 C) (Temporal)   Ht 5' 2.5" (1.588 m)   Wt 242 lb 3.2 oz (109.9 kg)   LMP  03/07/2006 (Approximate)   SpO2 95%   BMI 43.59 kg/m   Physical Exam Constitutional:      Appearance: She is obese.  Cardiovascular:     Rate and Rhythm: Normal rate and regular rhythm.     Pulses: Normal pulses.     Heart sounds: Normal heart sounds.  Pulmonary:     Effort: Pulmonary effort is normal.     Breath sounds: Normal breath sounds.  Musculoskeletal:     Right lower leg: No edema.     Left lower leg: No edema.  Neurological:     Mental Status: She is alert and oriented to person, place, and time.  Psychiatric:        Mood and Affect: Mood normal.        Behavior: Behavior normal.        Thought Content: Thought content normal.     Assessment & Plan:  This visit occurred during the SARS-CoV-2 public health emergency.  Safety protocols were in place, including screening questions prior to the visit, additional usage of staff PPE, and extensive cleaning of exam room while observing appropriate contact time as indicated for disinfecting solutions.   [name] was seen today for follow-up.  Diagnoses and all orders for this visit:  Type 2 diabetes mellitus without complication, with long-term current use of insulin (HCC) -     Hemoglobin A1c -     Comprehensive metabolic panel -     Lipid panel -     Microalbumin / creatinine urine ratio -     Urinalysis w microscopic + reflex cultur -     empagliflozin (JARDIANCE) 10 MG TABS tablet; Take 1 tablet (10 mg total) by mouth daily. -     Discontinue: insulin detemir (LEVEMIR FLEXTOUCH) 100 UNIT/ML FlexPen; Inject 20 Units into the skin at bedtime. -     insulin detemir (LEVEMIR FLEXTOUCH) 100 UNIT/ML FlexPen; Inject 10 Units into the skin at bedtime.  Psoriasis -     nystatin-triamcinolone (MYCOLOG II) cream; Apply 1 application topically 2 (two) times daily.  Encounter for lipid screening for cardiovascular disease -     Lipid panel  Chronic obstructive pulmonary disease with acute exacerbation (HCC) -     SYMBICORT  160-4.5 MCG/ACT inhaler; Inhale 2 puffs into the lungs 2 (two) times daily. FOR COPD  Continuous tobacco abuse  Hypertension associated with diabetes (HCC) -     hydrochlorothiazide (HYDRODIURIL) 12.5 MG tablet; Take 1 tablet (12.5 mg total) by mouth daily.  Gastroesophageal reflux disease without esophagitis -     pantoprazole (PROTONIX) 20 MG tablet; Take 1 tablet (20 mg total) by mouth daily before breakfast.  Candida cystitis -     Urine Culture -     REFLEXIVE URINE CULTURE -     fluconazole (DIFLUCAN) 150 MG tablet; Take 1 tablet (150 mg total) by mouth daily. Take second tab 3days apart from first tab  Problem List Items Addressed This Visit      Cardiovascular and Mediastinum   Hypertension associated with diabetes (HCC)    BP at goal Does not take HCTZ as precribed BP Readings from Last 3 Encounters:  06/08/20 136/88  04/01/20 130/84  07/09/19 118/76   Repeat CMP: stable resume HCTZ 12.5mg  Advised about the importance of medication, diet and exercise compliance.      Relevant Medications   empagliflozin (JARDIANCE) 10 MG TABS tablet   hydrochlorothiazide (HYDRODIURIL) 12.5 MG tablet   insulin detemir (LEVEMIR FLEXTOUCH) 100 UNIT/ML FlexPen     Respiratory   COPD (chronic obstructive pulmonary disease) (HCC)    Resume use of symbicort daily and albuterol prn Advised about the importance of tobacco cessation      Relevant Medications   SYMBICORT 160-4.5 MCG/ACT inhaler     Digestive   GERD (gastroesophageal reflux disease)   Relevant Medications   pantoprazole (PROTONIX) 20 MG tablet     Endocrine   Type 2 diabetes mellitus without complication, with long-term current use of insulin (HCC) - Primary    Does not take jardiance and levemir as prescribed Glucose check 3x/week: 140s-150s per patient. No neuropathy, no vaginal itching/discharge, no urinary symptoms. Drinks 4-16oz bottles of water per day. Has upcoming eye exam Normal foot exam  today  Repeat HgbA1c, CMP, lipid panel, urinalysis and urine microalbumin today. HgbA1c at 7.7: resume jardiance 10mg  and levemir 10uints LDL at goal Normal urine microalbumin F/up in 56months      Relevant Medications   empagliflozin (JARDIANCE) 10 MG TABS tablet   insulin detemir (LEVEMIR FLEXTOUCH) 100 UNIT/ML FlexPen   Other Relevant Orders   Hemoglobin A1c (Completed)   Comprehensive metabolic panel (Completed)   Lipid panel (Completed)   Microalbumin / creatinine urine ratio (Completed)   Urinalysis w microscopic + reflex cultur (Completed)     Musculoskeletal and Integument   Psoriasis   Relevant Medications   nystatin-triamcinolone (MYCOLOG II) cream     Other   Continuous tobacco abuse    Continues to smoke 1ppd. No intention on quitting at this time       Other Visit Diagnoses    Encounter for lipid screening for cardiovascular disease       Relevant Orders   Lipid panel (Completed)   Candida cystitis       Relevant Medications   nystatin-triamcinolone (MYCOLOG II) cream   fluconazole (DIFLUCAN) 150 MG tablet   Other Relevant Orders   Urine Culture (Completed)   REFLEXIVE URINE CULTURE (Completed)      Follow-up: Return in about 3 months (around 09/07/2020) for DM and HTN.  11/08/2020, NP

## 2020-06-08 NOTE — Assessment & Plan Note (Signed)
Resume use of symbicort daily and albuterol prn Advised about the importance of tobacco cessation

## 2020-06-08 NOTE — Assessment & Plan Note (Addendum)
Does not take jardiance and levemir as prescribed Glucose check 3x/week: 140s-150s per patient. No neuropathy, no vaginal itching/discharge, no urinary symptoms. Drinks 4-16oz bottles of water per day. Has upcoming eye exam Normal foot exam today  Repeat HgbA1c, CMP, lipid panel, urinalysis and urine microalbumin today. HgbA1c at 7.7: resume jardiance 10mg  and levemir 10uints LDL at goal Normal urine microalbumin F/up in 32months

## 2020-06-08 NOTE — Assessment & Plan Note (Addendum)
BP at goal Does not take HCTZ as precribed BP Readings from Last 3 Encounters:  06/08/20 136/88  04/01/20 130/84  07/09/19 118/76   Repeat CMP: stable resume HCTZ 12.5mg  Advised about the importance of medication, diet and exercise compliance.

## 2020-06-10 LAB — URINE CULTURE
MICRO NUMBER:: 11731801
Result:: NO GROWTH
SPECIMEN QUALITY:: ADEQUATE

## 2020-06-10 LAB — URINALYSIS W MICROSCOPIC + REFLEX CULTURE
Bilirubin Urine: NEGATIVE
Hgb urine dipstick: NEGATIVE
Hyaline Cast: NONE SEEN /LPF
Ketones, ur: NEGATIVE
Leukocyte Esterase: NEGATIVE
Nitrites, Initial: NEGATIVE
Protein, ur: NEGATIVE
Specific Gravity, Urine: 1.028 (ref 1.001–1.03)
WBC, UA: NONE SEEN /HPF (ref 0–5)
pH: 6 (ref 5.0–8.0)

## 2020-06-10 LAB — CULTURE INDICATED

## 2020-06-11 ENCOUNTER — Encounter: Payer: Self-pay | Admitting: Nurse Practitioner

## 2020-06-11 MED ORDER — LEVEMIR FLEXTOUCH 100 UNIT/ML ~~LOC~~ SOPN: 20.0000 [IU] | PEN_INJECTOR | Freq: Every day | SUBCUTANEOUS | 1 refills | Status: DC

## 2020-06-11 MED ORDER — PANTOPRAZOLE SODIUM 20 MG PO TBEC: 20.0000 mg | DELAYED_RELEASE_TABLET | Freq: Every day | ORAL | 0 refills | Status: DC

## 2020-06-11 MED ORDER — HYDROCHLOROTHIAZIDE 12.5 MG PO TABS: 12.5000 mg | ORAL_TABLET | Freq: Every day | ORAL | 1 refills | Status: DC

## 2020-06-11 MED ORDER — LEVEMIR FLEXTOUCH 100 UNIT/ML ~~LOC~~ SOPN: 10.0000 [IU] | PEN_INJECTOR | Freq: Every day | SUBCUTANEOUS | 1 refills | Status: DC

## 2020-06-11 MED ORDER — FLUCONAZOLE 150 MG PO TABS: 150.0000 mg | ORAL_TABLET | Freq: Every day | ORAL | 0 refills | Status: DC

## 2020-06-11 MED ORDER — EMPAGLIFLOZIN 10 MG PO TABS: 10.0000 mg | ORAL_TABLET | Freq: Every day | ORAL | 1 refills | Status: DC

## 2020-06-16 ENCOUNTER — Telehealth: Payer: Self-pay | Admitting: Nurse Practitioner

## 2020-06-16 NOTE — Telephone Encounter (Signed)
Pharmacy has been contacted with updated directions. Per patient ov note

## 2020-06-16 NOTE — Telephone Encounter (Signed)
What is the name of the medication? insulin detemir (LEVEMIR FLEXTOUCH) 100 UNIT/ML FlexPen [[id number]]    Have you contacted your pharmacy to request a refill? Pharmacist from [facility] is calling to get a clarification with Levemiri. He is saying he has gotten conflicting directions- one says inject 10units at a time the other says inject 20units at a time. Please call and advise how many units.   Which pharmacy would you like this sent to? Pharmacy  [facility] [phone] [place], [place] - [address]  [address] [place] [address]  Phone:  [phone] Fax:  [phone]  DEA #:  --      Patient notified that their request is being sent to the clinical staff for review and that they should receive a call once it is complete. If they do not receive a call within 72 hours they can check with their pharmacy or our office.

## 2020-09-17 ENCOUNTER — Other Ambulatory Visit: Payer: Self-pay | Admitting: Nurse Practitioner

## 2020-09-17 DIAGNOSIS — E119 Type 2 diabetes mellitus without complications: Secondary | ICD-10-CM

## 2020-09-17 DIAGNOSIS — Z794 Long term (current) use of insulin: Secondary | ICD-10-CM

## 2020-09-17 NOTE — Telephone Encounter (Signed)
Chart supports rx refill Last ov 06/08/2020

## 2020-09-23 ENCOUNTER — Ambulatory Visit: Payer: 59 | Admitting: Nurse Practitioner

## 2020-09-29 IMAGING — DX DG HUMERUS 2V *L*
2 series · 2 of 2 positions shown · non-contrast
Comparison: None.

CLINICAL DATA: Left arm pain

EXAM:
LEFT HUMERUS - 2+ VIEW

[humerus ap]
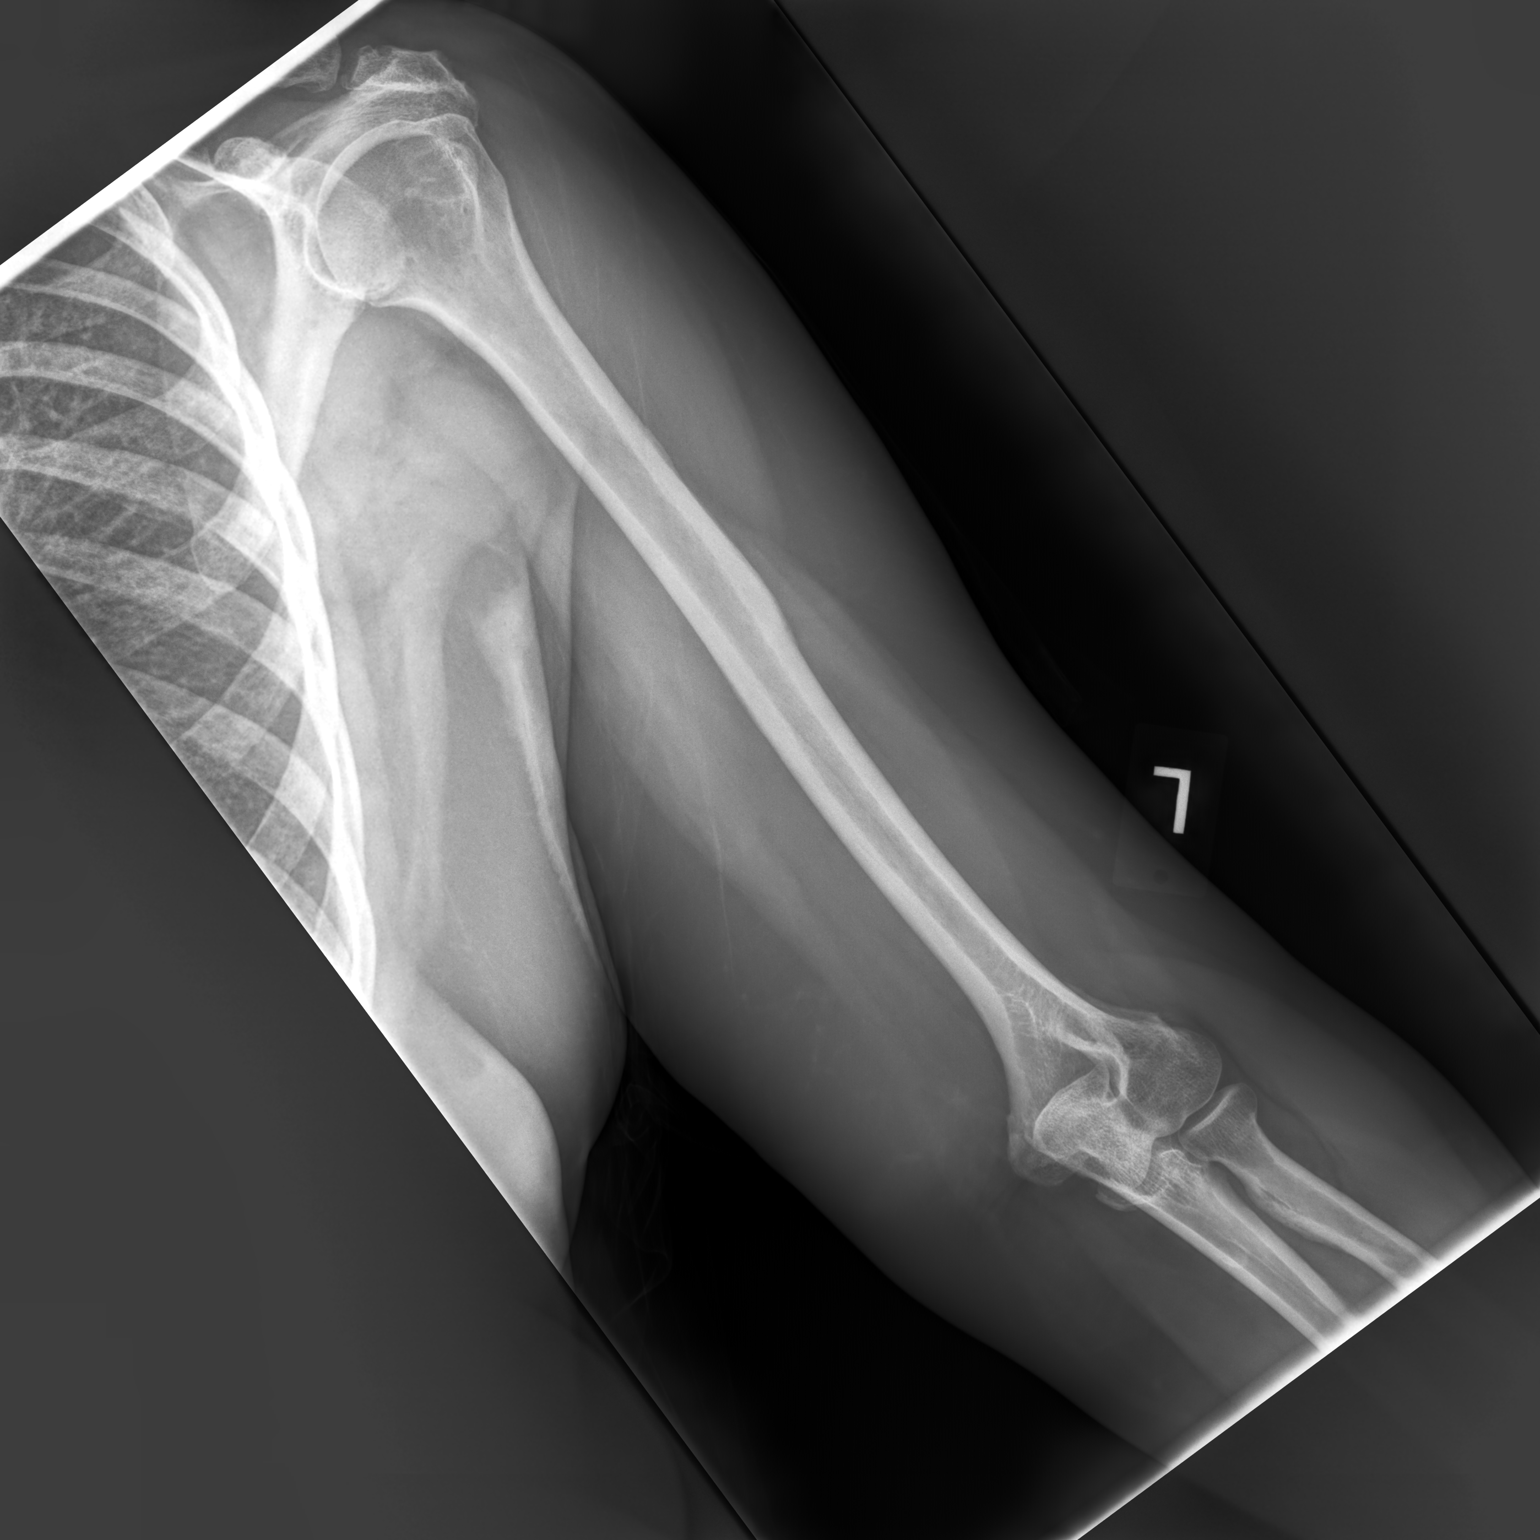

[humerus lat]
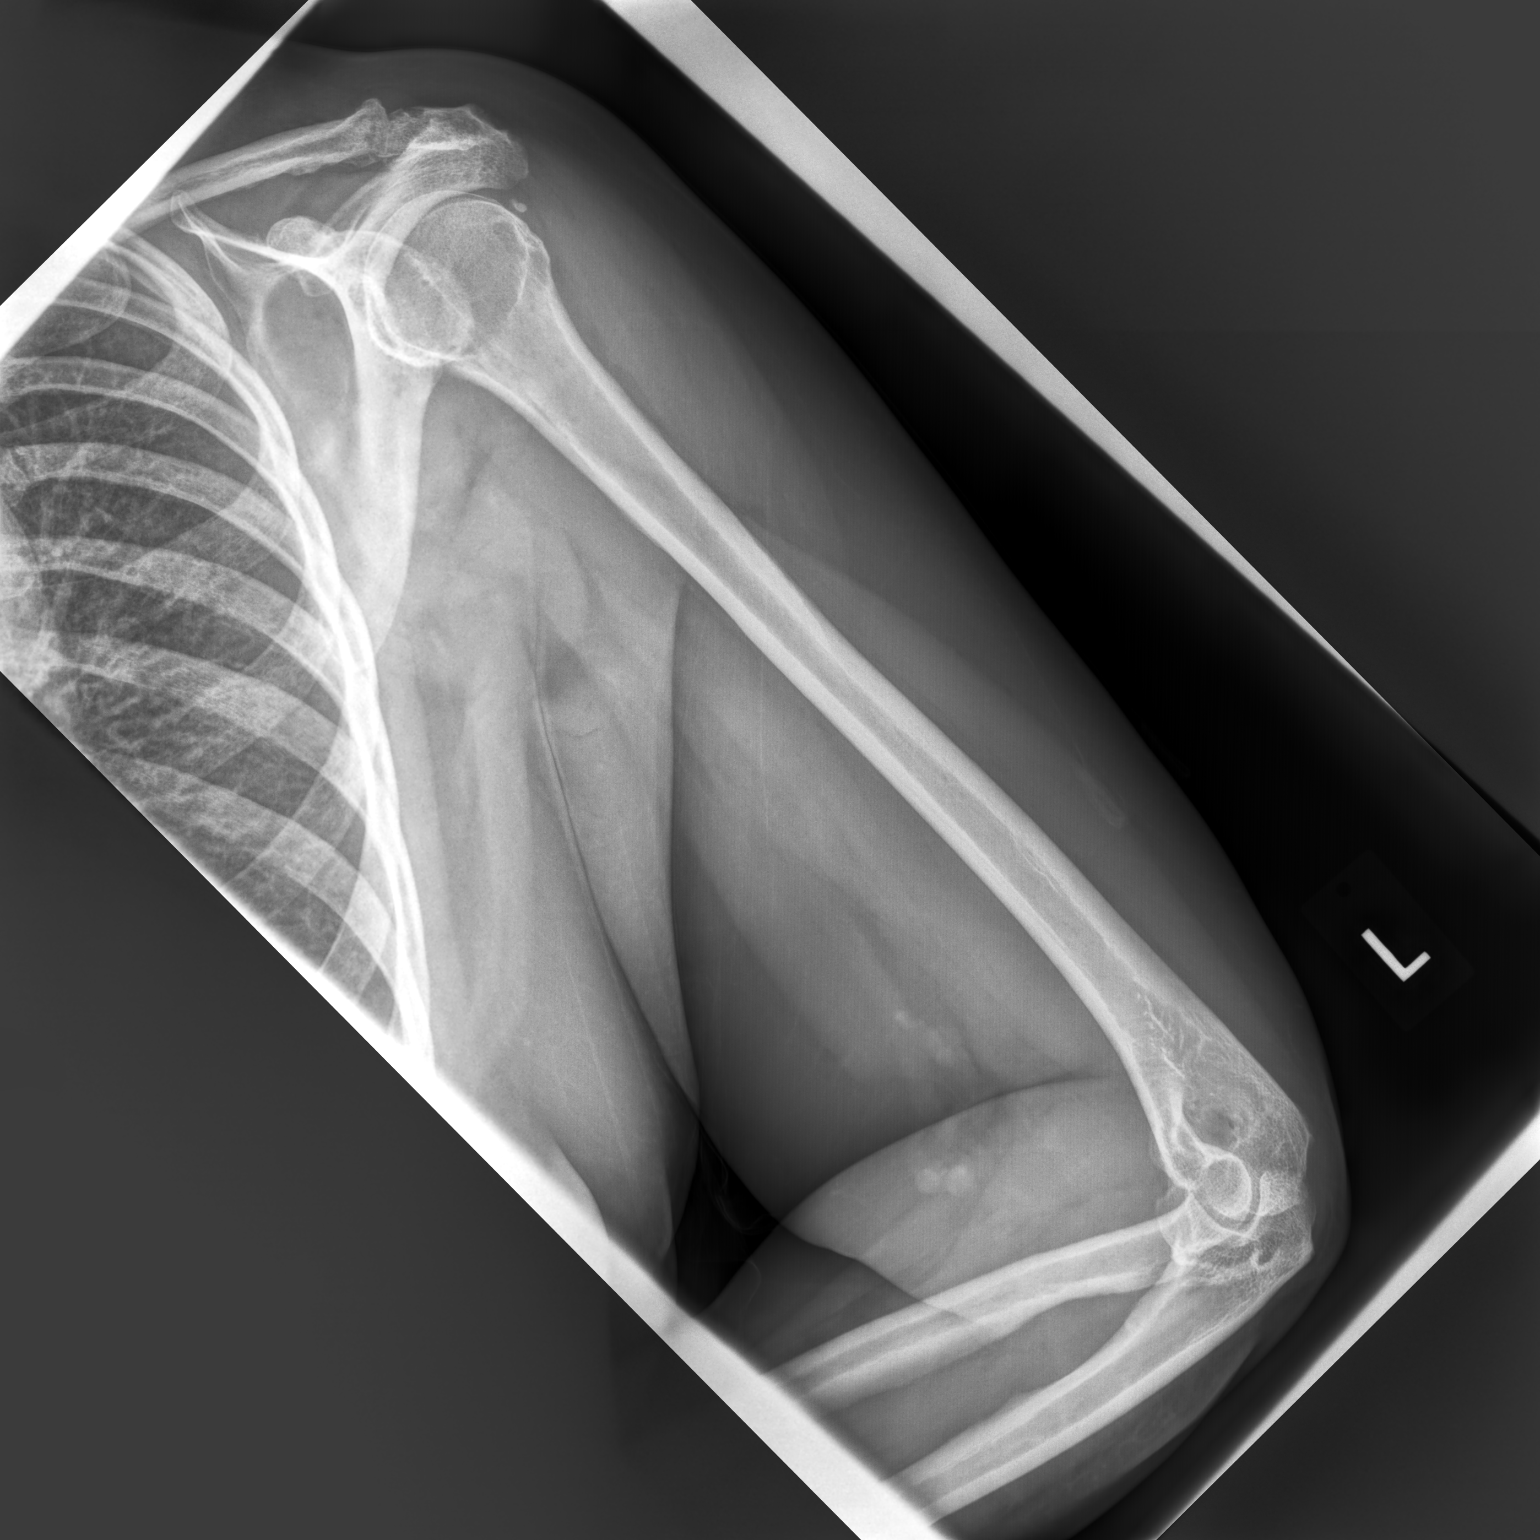

[2 of 2 positions shown; findings below may reference images not displayed]

FINDINGS: No fracture or dislocation. A tiny calcifications seen the posterior
aspect of the humeral head. Moderate glenohumeral joint
osteoarthritis seen with joint space loss. AC joint arthrosis seen
with joint space loss marginal osteophyte formation.
IMPRESSION: No acute osseous abnormality.

## 2021-03-28 ENCOUNTER — Other Ambulatory Visit: Payer: Self-pay | Admitting: Nurse Practitioner

## 2021-03-28 DIAGNOSIS — K219 Gastro-esophageal reflux disease without esophagitis: Secondary | ICD-10-CM

## 2021-03-31 NOTE — Telephone Encounter (Signed)
Chart supports Rx Last seen 06/08/20 Contacted patient and informed her she needs to schedule a follow up appointment for additional refills after this Rx.

## 2021-04-16 ENCOUNTER — Other Ambulatory Visit: Payer: Self-pay | Admitting: Nurse Practitioner

## 2021-04-16 DIAGNOSIS — K219 Gastro-esophageal reflux disease without esophagitis: Secondary | ICD-10-CM

## 2021-04-25 ENCOUNTER — Other Ambulatory Visit: Payer: Self-pay | Admitting: Nurse Practitioner

## 2021-04-27 ENCOUNTER — Other Ambulatory Visit: Payer: Self-pay

## 2021-04-27 ENCOUNTER — Other Ambulatory Visit: Payer: Self-pay | Admitting: Nurse Practitioner

## 2021-04-27 DIAGNOSIS — E119 Type 2 diabetes mellitus without complications: Secondary | ICD-10-CM

## 2021-04-27 DIAGNOSIS — Z794 Long term (current) use of insulin: Secondary | ICD-10-CM

## 2021-04-27 MED ORDER — LEVEMIR FLEXTOUCH 100 UNIT/ML ~~LOC~~ SOPN: 10.0000 [IU] | PEN_INJECTOR | Freq: Every day | SUBCUTANEOUS | 1 refills | Status: DC

## 2021-04-27 NOTE — Telephone Encounter (Signed)
Rx request from pharmacy faxed over.  Chart supports Rx Last seen 06/08/20 Next OV 07/16/21

## 2021-04-29 ENCOUNTER — Other Ambulatory Visit: Payer: Self-pay | Admitting: Nurse Practitioner

## 2021-04-29 DIAGNOSIS — E119 Type 2 diabetes mellitus without complications: Secondary | ICD-10-CM

## 2021-04-30 ENCOUNTER — Other Ambulatory Visit: Payer: Self-pay | Admitting: Nurse Practitioner

## 2021-04-30 DIAGNOSIS — E119 Type 2 diabetes mellitus without complications: Secondary | ICD-10-CM

## 2021-04-30 DIAGNOSIS — Z794 Long term (current) use of insulin: Secondary | ICD-10-CM

## 2021-04-30 NOTE — Telephone Encounter (Signed)
Pharmacy requesting new Rx due to back order of requested medication.

## 2021-04-30 NOTE — Telephone Encounter (Signed)
Spoke with pharmacy and he states that we have been sending it in wrong and the Rx needs to say Flexpen. I replaced it as he instructed.

## 2021-05-28 ENCOUNTER — Telehealth: Payer: Self-pay | Admitting: Nurse Practitioner

## 2021-05-28 NOTE — Telephone Encounter (Signed)
Pt called in wanting a referral to a cardiologist. I offered an appointment, she declined and asked me to put a phone message in. I asked why she needed to see a cardiologist and she stated she was having chest discomfort and her arm was in pain. I transferred her straight over to Nurse Triage.   ?

## 2021-05-29 ENCOUNTER — Encounter (HOSPITAL_COMMUNITY): Payer: Self-pay | Admitting: *Deleted

## 2021-05-29 ENCOUNTER — Emergency Department (HOSPITAL_COMMUNITY): Payer: 59

## 2021-05-29 ENCOUNTER — Other Ambulatory Visit: Payer: Self-pay

## 2021-05-29 ENCOUNTER — Emergency Department (HOSPITAL_COMMUNITY)
Admission: EM | Admit: 2021-05-29 | Discharge: 2021-05-29 | Disposition: A | Discharge status: Home / Self Care | Payer: 59 | Attending: Emergency Medicine | Admitting: Emergency Medicine

## 2021-05-29 DIAGNOSIS — Z7982 Long term (current) use of aspirin: Secondary | ICD-10-CM | POA: Insufficient documentation

## 2021-05-29 DIAGNOSIS — M79622 Pain in left upper arm: Secondary | ICD-10-CM | POA: Diagnosis not present

## 2021-05-29 DIAGNOSIS — R0789 Other chest pain: Secondary | ICD-10-CM | POA: Insufficient documentation

## 2021-05-29 DIAGNOSIS — R079 Chest pain, unspecified: Secondary | ICD-10-CM

## 2021-05-29 DIAGNOSIS — I10 Essential (primary) hypertension: Secondary | ICD-10-CM | POA: Insufficient documentation

## 2021-05-29 DIAGNOSIS — L989 Disorder of the skin and subcutaneous tissue, unspecified: Secondary | ICD-10-CM | POA: Diagnosis not present

## 2021-05-29 DIAGNOSIS — E119 Type 2 diabetes mellitus without complications: Secondary | ICD-10-CM | POA: Insufficient documentation

## 2021-05-29 DIAGNOSIS — Z79899 Other long term (current) drug therapy: Secondary | ICD-10-CM | POA: Diagnosis not present

## 2021-05-29 DIAGNOSIS — Z794 Long term (current) use of insulin: Secondary | ICD-10-CM | POA: Insufficient documentation

## 2021-05-29 LAB — CBC WITH DIFFERENTIAL/PLATELET
Abs Immature Granulocytes: 0.06 10*3/uL (ref 0.00–0.07)
Basophils Absolute: 0.1 10*3/uL (ref 0.0–0.1)
Basophils Relative: 1 %
Eosinophils Absolute: 0.4 10*3/uL (ref 0.0–0.5)
Eosinophils Relative: 6 %
HCT: 43.6 % (ref 36.0–46.0)
Hemoglobin: 14.4 g/dL (ref 12.0–15.0)
Immature Granulocytes: 1 %
Lymphocytes Relative: 23 %
Lymphs Abs: 1.7 10*3/uL (ref 0.7–4.0)
MCH: 31.9 pg (ref 26.0–34.0)
MCHC: 33 g/dL (ref 30.0–36.0)
MCV: 96.5 fL (ref 80.0–100.0)
Monocytes Absolute: 0.5 10*3/uL (ref 0.1–1.0)
Monocytes Relative: 7 %
Neutro Abs: 4.6 10*3/uL (ref 1.7–7.7)
Neutrophils Relative %: 62 %
Platelets: 235 10*3/uL (ref 150–400)
RBC: 4.52 MIL/uL (ref 3.87–5.11)
RDW: 12.5 % (ref 11.5–15.5)
WBC: 7.4 10*3/uL (ref 4.0–10.5)
nRBC: 0 % (ref 0.0–0.2)

## 2021-05-29 LAB — TROPONIN I (HIGH SENSITIVITY)
Troponin I (High Sensitivity): 3 ng/L (ref <18)
Troponin I (High Sensitivity): 3 ng/L (ref <18)

## 2021-05-29 LAB — BASIC METABOLIC PANEL
Anion gap: 9 (ref 5–15)
BUN: 16 mg/dL (ref 6–20)
CO2: 24 mmol/L (ref 22–32)
Calcium: 9.6 mg/dL (ref 8.9–10.3)
Chloride: 104 mmol/L (ref 98–111)
Creatinine, Ser: 0.91 mg/dL (ref 0.44–1.00)
GFR, Estimated: >60 mL/min (ref >60)
Glucose, Bld: 197 mg/dL — ABNORMAL HIGH (ref 70–99)
Potassium: 3.9 mmol/L (ref 3.5–5.1)
Sodium: 137 mmol/L (ref 135–145)

## 2021-05-29 NOTE — ED Provider Triage Note (Signed)
Emergency Medicine Provider Triage Evaluation Note ? ?Judy Fischer , a 61 y.o. female  was evaluated in triage.  Pt complains of chest pain and left arm pain.  States she has had left arm pain over the last 2 weeks.  No recent injury or trauma.  No swelling, redness or warmth.  Pain worse with movement.  Patient also noted the last few days she has been having some intermittent left-sided chest pain.  Nonexertional nonpleuritic in nature.  No associate diaphoresis, nausea, vomiting, shortness of breath.  No history of PE or DVT.  No swelling in lower extremities.  Has had some aching pain in her bilateral lower extremities left greater than right over the last month.  No recent surgery, mobilization or malignancy ? ?Review of Systems  ?Positive: Chest pain, left arm pain ?Negative: Numbness, weakness, fever ? ?Physical Exam  ?BP 139/89 (BP Location: Right Arm)   Pulse 89   Temp 98.6 ?F (37 ?C) (Oral)   Resp 18   LMP 03/07/2006 (Approximate)   SpO2 94%  ?Gen:   Awake, no distress   ?Resp:  Normal effort  ?MSK:   Moves extremities without difficulty  ?Other:   ? ?Medical Decision Making  ?Medically screening exam initiated at 5:29 AM.  Appropriate orders placed.  Judy Fischer was informed that the remainder of the evaluation will be completed by another provider, this initial triage assessment does not replace that evaluation, and the importance of remaining in the ED until their evaluation is complete. ? ?CP, left arm pain ?  ?Ismaeel Arvelo A, PA-C ?05/29/21 0530 ? ?

## 2021-05-29 NOTE — ED Provider Notes (Signed)
?[facility] EMERGENCY DEPARTMENT ?Provider Note ? ? ?CSN: [id number] ?Arrival date & time: 05/29/21  0516 ? ?  ? ?History ? ?Chief Complaint  ?Patient presents with  ? Chest Pain  ? ? ?Judy Fischer is a 61 y.o. female. ? ?Chest Pain ?Associated symptoms: no cough, no diaphoresis, no fever, no nausea, no numbness, no shortness of breath, no vomiting and no weakness   ? ?HPI: A 61 year old patient with a history of treated diabetes, hypertension and obesity presents for evaluation of chest pain. Initial onset of pain was more than 6 hours ago. The patient's chest pain is well-localized, is sharp and is not worse with exertion. The patient's chest pain is middle- or left-sided, is not described as heaviness/pressure/tightness and does not radiate to the arms/jaw/neck. The patient does not complain of nausea and denies diaphoresis. The patient has no history of stroke, has no history of peripheral artery disease, has not smoked in the past 90 days, has no relevant family history of coronary artery disease (first degree relative at less than age 72) and has no history of hypercholesterolemia.   ? ?She is also complaining of left arm pain for 2 weeks.  There was no injury, or inciting event.  She feels she can move it well.  No aggravating or alleviating factors.  No numbness or tingling.  No recent surgery, immobilization, or malignancy. ? ?Past Medical History:  ?Diagnosis Date  ? Abnormal MRI of abdomen 08/13/2012  ? Tiny Liver lesions per [facility] records   ? Benign breast cyst in female   ? Need yearly mammograms, noted from [facility]   ? Bilateral breast cysts   ? benign, needs yearly mammograms, per [facility] Records   ? Diabetes (HCC)   ? Diabetes mellitus without complication (HCC)   ? Fatty liver   ? Noted in records received from [facility]   ? GERD (gastroesophageal reflux disease)   ? Heart murmur   ? mild  ? Hepatic cyst   ? bening on MRI 2014, Per records from  [facility]   ? History of blood transfusion 1987  ? Hypertension   ? Obesity   ? Pneumonia   ? Psoriasis   ? Noted in records received from [facility]   ? ? ?Home Medications ?Prior to Admission medications   ?Medication Sig Start Date End Date Taking? Authorizing Provider  ?Albuterol Sulfate (PROAIR RESPICLICK) 108 (90 Base) MCG/ACT AEPB Inhale 1 puff into the lungs every 6 (six) hours as needed. 04/23/18   Nche, Bonna Gains, NP  ?aspirin 81 MG tablet Take 81 mg by mouth daily.    [provider]  ?B-D ULTRAFINE III SHORT PEN 31G X 8 MM MISC USE AS DIRECTED WITH LEVEMIR PEN 04/26/21   Nche, Bonna Gains, NP  ?Continuous Blood Gluc Receiver (FREESTYLE LIBRE 14 DAY READER) DEVI 1 Units by Does not apply route once a week. 12/19/18   Nche, Bonna Gains, NP  ?Continuous Blood Gluc Sensor (FREESTYLE LIBRE 14 DAY SENSOR) MISC USE EVERY 14 DAYS AS DIRECTED 09/17/20   Nche, Bonna Gains, NP  ?diphenhydramine-acetaminophen (TYLENOL PM) 25-500 MG TABS tablet Take 1 tablet by mouth at bedtime as needed (sleep).     [provider]  ?empagliflozin (JARDIANCE) 10 MG TABS tablet Take 1 tablet (10 mg total) by mouth daily. 06/11/20   Nche, Bonna Gains, NP  ?fluconazole (DIFLUCAN) 150 MG tablet Take 1 tablet (150 mg total) by mouth daily. Take second tab 3days  apart from first tab 06/11/20   Nche, Bonna Gains, NP  ?fluticasone (FLONASE) 50 MCG/ACT nasal spray Place 1 spray into both nostrils daily. 07/12/17   Kirt Boys, DO  ?gabapentin (NEURONTIN) 100 MG capsule Take 1-2 capsules (100-200 mg total) by mouth 3 (three) times daily as needed. ?Patient not taking: Reported on 06/08/2020 06/11/19   Rodolph Bong, MD  ?hydrochlorothiazide (HYDRODIURIL) 12.5 MG tablet Take 1 tablet (12.5 mg total) by mouth daily. 06/11/20   Nche, Bonna Gains, NP  ?insulin detemir (LEVEMIR FLEXPEN) 100 UNIT/ML FlexPen Inject 10 Units into the skin daily. 04/30/21   Nche, Bonna Gains, NP  ?nystatin-triamcinolone (MYCOLOG II) cream  Apply 1 application topically 2 (two) times daily. 06/08/20   Nche, Bonna Gains, NP  ?pantoprazole (PROTONIX) 20 MG tablet TAKE 1 TABLET BY MOUTH EVERY DAY BEFORE BREAKFAST 04/20/21   Nche, Bonna Gains, NP  ?SYMBICORT 160-4.5 MCG/ACT inhaler Inhale 2 puffs into the lungs 2 (two) times daily. FOR COPD 06/08/20   Nche, Bonna Gains, NP  ?trolamine salicylate (ASPERCREME) 10 % cream Apply 1 application topically as needed for muscle pain.    [provider]  ?   ? ?Allergies    ?Metformin and related   ? ?Review of Systems   ?Review of Systems  ?Constitutional:  Negative for diaphoresis and fever.  ?Respiratory:  Negative for cough and shortness of breath.   ?Cardiovascular:  Positive for chest pain.  ?Gastrointestinal:  Negative for nausea and vomiting.  ?Musculoskeletal:   ?     Left arm pain  ?Neurological:  Negative for weakness and numbness.  ?All other systems reviewed and are negative. ? ?Physical Exam ?Updated Vital Signs ?BP 114/81 (BP Location: Right Arm)   Pulse 76   Temp 98.6 ?F (37 ?C) (Oral)   Resp 18   Ht 5\' 2"  (1.575 m)   Wt 104.3 kg   LMP 03/07/2006 (Approximate)   SpO2 96%   BMI 42.07 kg/m?  ?Physical Exam ?Vitals and nursing note reviewed.  ?Constitutional:   ?   Appearance: Normal appearance.  ?HENT:  ?   Head: Normocephalic and atraumatic.  ?Eyes:  ?   Conjunctiva/sclera: Conjunctivae normal.  ?Cardiovascular:  ?   Rate and Rhythm: Normal rate and regular rhythm.  ?   Pulses:     ?     Radial pulses are 2+ on the right side and 2+ on the left side.  ?Pulmonary:  ?   Effort: Pulmonary effort is normal. No respiratory distress.  ?   Breath sounds: Normal breath sounds.  ?Chest:  ?   Comments: Chest pain reproducible with palpation of the anterior left chest the midclavicular line under the breast ?Abdominal:  ?   General: There is no distension.  ?   Palpations: Abdomen is soft.  ?   Tenderness: There is no abdominal tenderness.  ?Skin: ?   General: Skin is warm and dry.  ?    Comments: Psoriatic lesions noted to the right lower back and left mamillary fold  ?Neurological:  ?   General: No focal deficit present.  ?   Mental Status: She is alert.  ? ? ?ED Results / Procedures / Treatments   ?Labs ?(all labs ordered are listed, but only abnormal results are displayed) ?Labs Reviewed  ?BASIC METABOLIC PANEL - Abnormal; Notable for the following components:  ?    Result Value  ? Glucose, Bld 197 (*)   ? All other components within normal limits  ?CBC WITH DIFFERENTIAL/PLATELET  ?  TROPONIN I (HIGH SENSITIVITY)  ?TROPONIN I (HIGH SENSITIVITY)  ? ? ?EKG ?EKG Interpretation ? ?Date/Time:  Saturday May 29 2021 05:18:26 EDT ?Ventricular Rate:  96 ?PR Interval:  132 ?QRS Duration: 84 ?QT Interval:  380 ?QTC Calculation: 480 ?R Axis:   33 ?Text Interpretation: Normal sinus rhythm Low voltage QRS Prolonged QT Abnormal ECG When compared with ECG of 16-Apr-2018 10:40, No significant change since last tracing Confirmed by Linwood DibblesKnapp, Jon [phone]([id number]) on 05/29/2021 8:59:20 AM ? ?Radiology ?DG Chest 2 View ? ?Result Date: 05/29/2021 ?CLINICAL DATA:  61 year old female with history of chest pain and left arm pain. EXAM: CHEST - 2 VIEW COMPARISON:  Chest x-ray 06/11/2019. FINDINGS: Lung volumes are normal. No consolidative airspace disease. No pleural effusions. No pneumothorax. Small nodular density projecting over the right upper lobe in the suprahilar region. Pulmonary vasculature and the cardiomediastinal silhouette are within normal limits. IMPRESSION: 1. No radiographic evidence of acute cardiopulmonary disease. 2. Small nodular density projecting over the right upper lobe. This could conceivably be a normal vascular structure, however, follow-up nonemergent contrast enhanced chest CT is suggested in the near future to better evaluate this finding and exclude neoplasm. Electronically Signed   By: Trudie Reedaniel  Entrikin M.D.   On: 05/29/2021 06:43   ? ?Procedures ?Procedures  ? ? ?Medications Ordered in ED ?Medications  - No data to display ? ?ED Course/ Medical Decision Making/ A&P ?  ?HEAR Score: 3                       ?Medical Decision Making ? ?This patient presents to the ED for concern of chest pain, this involves an extensive numb

## 2021-05-29 NOTE — ED Notes (Signed)
Got patient on the monitor patient is resting with call bell in reach  ?

## 2021-05-29 NOTE — ED Triage Notes (Signed)
C/o left arm pain and left leg pain onset several weeks ago , also c/o chest pain that comes and goes states she will have a fluttering feeling a sharp pain then the pain will go away onset 2 weeks ago , no change in pain today states she has been watching the news and they keep stressing about being heart healthy  ?

## 2021-05-29 NOTE — Discharge Instructions (Addendum)
You were seen in the emergency department today for chest pain. ? ?As we discussed all your lab work and imaging has looked very reassuring today.  I always recommended you follow-up with your primary doctor within a week of having been seen in the ER for a follow-up appointment. ? ?Continue to monitor how you're doing and return to the ER for new or worsening symptoms.  ?

## 2021-07-02 ENCOUNTER — Other Ambulatory Visit: Payer: Self-pay | Admitting: Nurse Practitioner

## 2021-07-02 DIAGNOSIS — E119 Type 2 diabetes mellitus without complications: Secondary | ICD-10-CM

## 2021-07-06 NOTE — Telephone Encounter (Signed)
Chart supports Rx ?Last seen 06/2020 ?Next OV 07/2021 ?Sent in Rx for 30 days, and informed patient to keep upcoming appointment for continued refills.  ?

## 2021-07-16 ENCOUNTER — Ambulatory Visit: Payer: 59 | Admitting: Nurse Practitioner

## 2021-07-27 ENCOUNTER — Ambulatory Visit: Payer: 59 | Admitting: Nurse Practitioner

## 2021-07-30 ENCOUNTER — Ambulatory Visit: Payer: 59 | Admitting: Nurse Practitioner

## 2021-08-14 ENCOUNTER — Other Ambulatory Visit: Payer: Self-pay | Admitting: Nurse Practitioner

## 2021-08-14 DIAGNOSIS — I152 Hypertension secondary to endocrine disorders: Secondary | ICD-10-CM

## 2021-08-16 NOTE — Telephone Encounter (Signed)
Needs appt , hasn't been seen in a year. Last 3 appts canceled

## 2021-08-20 NOTE — Progress Notes (Unsigned)
I, Christoper Fabian, LAT, ATC, am serving as scribe for Dr. [name].  Judy Fischer is a 61 y.o. 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  She was last seen by Dr. Denyse Amass on 04/01/20 w/ worsening knee pain and had a R knee steroid injection.  Today, pt reports that her R knee has been bothering her again x 2 months.  She states that she has constant swelling in her R knee and notes mechanical symptoms in her knee as well.  She also notes L knee pain.  Diagnostic imaging: R knee XR- 04/01/20; R and L knee XR- 03/31/17  Pertinent review of systems: No fevers or chills  Relevant historical information: Diabetes   Exam:  BP 120/80 (BP Location: Right Arm, Patient Position: Sitting, Cuff Size: Large)   Pulse 81   Ht 5\' 2"  (1.575 m)   Wt 225 lb (102.1 kg)   LMP 03/07/2006 (Approximate)   SpO2 92%   BMI 41.15 kg/m  General: Well Developed, well nourished, and in no acute distress.   MSK: Right knee: Mild effusion.  Normal-appearing otherwise. Normal motion. Intact strength. Stable ligamentous exam.  Left knee: Mild effusion. Nontender. Intact strength. Stable ligamentous exam.    Lab and Radiology Results  Procedure: Real-time Ultrasound Guided Injection of right knee superior lateral patellar space Device: Philips Affiniti 50G Images permanently stored and available for review in PACS Verbal informed consent obtained.  Discussed risks and benefits of procedure. Warned about infection, bleeding, hyperglycemia damage to structures among others. Patient expresses understanding and agreement Time-out conducted.   Noted no overlying erythema, induration, or other signs of local infection.   Skin prepped in a sterile fashion.   Local anesthesia: Topical Ethyl chloride.   With sterile technique and under real time ultrasound guidance: 40 mg of Kenalog and 2 mL of Marcaine injected into knee  joint. Fluid seen entering the joint capsule.   Completed without difficulty   Pain immediately resolved suggesting accurate placement of the medication.   Advised to call if fevers/chills, erythema, induration, drainage, or persistent bleeding.   Images permanently stored and available for review in the ultrasound unit.  Impression: Technically successful ultrasound guided injection.   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  Moderate patellofemoral DJD.  No acute fractures are present.  Left knee: Mild medial and lateral DJD.  Moderate patellofemoral DJD.  No acute fractures are present.  Await formal radiology review      Assessment and Plan: 61 y.o. female with bilateral knee pain right worse than left.  Pain thought to be due to exacerbation of DJD.  Plan for right knee steroid injection today.  Recommend Voltaren gel as well.  Check back with me as needed.  Recommend scheduling with PCP to reestablish care.  Her last visit was in April 2022 for diabetes.  She could use a recheck especially for some of her chronic health care issues such as diabetes mammogram etc.   PDMP not reviewed this encounter. Orders Placed This Encounter  Procedures   May 2022 LIMITED JOINT SPACE STRUCTURES LOW RIGHT(NO LINKED CHARGES)    Order Specific Question:   Reason for Exam (SYMPTOM  OR DIAGNOSIS REQUIRED)    Answer:   R knee pain    Order Specific Question:   Preferred imaging location?    Answer:  [facility] Knee AP/LAT W/[place] Left    Standing Status:   Future    Number of Occurrences:   1    Standing Expiration Date:   09/22/2021    Order Specific Question:   Reason for Exam (SYMPTOM  OR DIAGNOSIS REQUIRED)    Answer:   L knee pain    Order Specific Question:   Is patient pregnant?    Answer:   No    Order Specific Question:   Preferred imaging location?     Answer:   [facility]   DG Knee AP/LAT W/[place] Right    Standing Status:   Future    Number of Occurrences:   1    Standing Expiration Date:   09/22/2021    Order Specific Question:   Reason for Exam (SYMPTOM  OR DIAGNOSIS REQUIRED)    Answer:   R knee pain    Order Specific Question:   Is patient pregnant?    Answer:   No    Order Specific Question:   Preferred imaging location?    Answer:   [facility]   No orders of the defined types were placed in this encounter.    Discussed warning signs or symptoms. Please see discharge instructions. Patient expresses understanding.   The above documentation has been reviewed and is accurate and complete [name], M.D.

## 2021-08-23 ENCOUNTER — Ambulatory Visit (INDEPENDENT_AMBULATORY_CARE_PROVIDER_SITE_OTHER): Payer: 59

## 2021-08-23 ENCOUNTER — Encounter: Payer: Self-pay | Admitting: Family Medicine

## 2021-08-23 ENCOUNTER — Ambulatory Visit: Payer: Self-pay

## 2021-08-23 ENCOUNTER — Ambulatory Visit (INDEPENDENT_AMBULATORY_CARE_PROVIDER_SITE_OTHER): Payer: 59 | Admitting: Family Medicine

## 2021-08-23 VITALS — BP 120/80 | HR 81 | Ht 62.0 in | Wt 225.0 lb

## 2021-08-23 DIAGNOSIS — G8929 Other chronic pain: Secondary | ICD-10-CM

## 2021-08-23 DIAGNOSIS — M25562 Pain in left knee: Secondary | ICD-10-CM

## 2021-08-23 DIAGNOSIS — M25561 Pain in right knee: Secondary | ICD-10-CM | POA: Diagnosis not present

## 2021-08-23 NOTE — Patient Instructions (Addendum)
Good to see you today.  You had a R knee injection.  Call or go to the ER if you develop a large red swollen joint with extreme pain or oozing puss.   Please get an Xray today before you leave.  Follow-up as needed.  Schedule with your primary care provider [phone]

## 2021-08-24 NOTE — Progress Notes (Signed)
Right knee x-ray shows medium to severe arthritis changes.

## 2021-08-24 NOTE — Progress Notes (Signed)
Left knee x-ray shows some medium arthritis changes.

## 2021-09-26 ENCOUNTER — Other Ambulatory Visit: Payer: Self-pay | Admitting: Nurse Practitioner

## 2021-09-26 DIAGNOSIS — Z794 Long term (current) use of insulin: Secondary | ICD-10-CM

## 2021-09-26 DIAGNOSIS — E1159 Type 2 diabetes mellitus with other circulatory complications: Secondary | ICD-10-CM

## 2021-09-27 NOTE — Telephone Encounter (Signed)
Last OV: 06/2020 Next OV:  Not scheduled   Pt needs appt for refills. Has not been seen in over a year

## 2021-10-01 ENCOUNTER — Other Ambulatory Visit: Payer: Self-pay | Admitting: Nurse Practitioner

## 2021-10-01 DIAGNOSIS — E119 Type 2 diabetes mellitus without complications: Secondary | ICD-10-CM

## 2021-10-01 DIAGNOSIS — Z794 Long term (current) use of insulin: Secondary | ICD-10-CM

## 2021-10-01 DIAGNOSIS — I152 Hypertension secondary to endocrine disorders: Secondary | ICD-10-CM

## 2021-10-04 NOTE — Telephone Encounter (Signed)
Chart supports Rx Last OV: 06/2020 Next OV: Not scheduled  30 day supply given, pt needs appt for further refills

## 2021-10-31 ENCOUNTER — Other Ambulatory Visit: Payer: Self-pay | Admitting: Nurse Practitioner

## 2021-10-31 DIAGNOSIS — I152 Hypertension secondary to endocrine disorders: Secondary | ICD-10-CM

## 2021-11-01 NOTE — Telephone Encounter (Signed)
Chart supports Rx Last OV: 06/2020 Next OV: not scheduled   30 day supply sent. Pt needs appt for further refills

## 2021-11-05 ENCOUNTER — Other Ambulatory Visit: Payer: Self-pay | Admitting: Nurse Practitioner

## 2021-11-05 DIAGNOSIS — E119 Type 2 diabetes mellitus without complications: Secondary | ICD-10-CM

## 2021-11-05 NOTE — Telephone Encounter (Signed)
Chart supports Rx Last OV: 06/2020 Next OV: not scheduled. 30 day supply sent, pt needs f/u appt for further refills.

## 2021-11-15 ENCOUNTER — Other Ambulatory Visit: Payer: Self-pay | Admitting: Nurse Practitioner

## 2021-11-15 DIAGNOSIS — E1159 Type 2 diabetes mellitus with other circulatory complications: Secondary | ICD-10-CM

## 2021-11-17 ENCOUNTER — Telehealth: Payer: Self-pay | Admitting: Nurse Practitioner

## 2021-11-17 NOTE — Telephone Encounter (Signed)
Pt would like TOC from [place] to dr Ardyth Harps due to closer location

## 2021-11-18 NOTE — Telephone Encounter (Signed)
Pt has been sch

## 2021-11-26 ENCOUNTER — Other Ambulatory Visit: Payer: Self-pay | Admitting: Nurse Practitioner

## 2021-11-26 DIAGNOSIS — E119 Type 2 diabetes mellitus without complications: Secondary | ICD-10-CM

## 2021-12-18 ENCOUNTER — Other Ambulatory Visit: Payer: Self-pay | Admitting: Nurse Practitioner

## 2021-12-18 DIAGNOSIS — I152 Hypertension secondary to endocrine disorders: Secondary | ICD-10-CM

## 2021-12-18 DIAGNOSIS — E1159 Type 2 diabetes mellitus with other circulatory complications: Secondary | ICD-10-CM

## 2021-12-23 ENCOUNTER — Other Ambulatory Visit: Payer: Self-pay | Admitting: Nurse Practitioner

## 2021-12-23 DIAGNOSIS — Z794 Long term (current) use of insulin: Secondary | ICD-10-CM

## 2022-02-07 LAB — HM DIABETES EYE EXAM

## 2022-02-22 ENCOUNTER — Ambulatory Visit (INDEPENDENT_AMBULATORY_CARE_PROVIDER_SITE_OTHER): Payer: 59 | Admitting: Internal Medicine

## 2022-02-22 ENCOUNTER — Encounter: Payer: Self-pay | Admitting: Internal Medicine

## 2022-02-22 VITALS — BP 110/80 | HR 67 | Temp 98.8°F | Ht 63.0 in | Wt 217.5 lb

## 2022-02-22 DIAGNOSIS — K219 Gastro-esophageal reflux disease without esophagitis: Secondary | ICD-10-CM | POA: Diagnosis not present

## 2022-02-22 DIAGNOSIS — I152 Hypertension secondary to endocrine disorders: Secondary | ICD-10-CM

## 2022-02-22 DIAGNOSIS — F17210 Nicotine dependence, cigarettes, uncomplicated: Secondary | ICD-10-CM | POA: Diagnosis not present

## 2022-02-22 DIAGNOSIS — E119 Type 2 diabetes mellitus without complications: Secondary | ICD-10-CM | POA: Diagnosis not present

## 2022-02-22 DIAGNOSIS — Z1211 Encounter for screening for malignant neoplasm of colon: Secondary | ICD-10-CM

## 2022-02-22 DIAGNOSIS — Z1239 Encounter for other screening for malignant neoplasm of breast: Secondary | ICD-10-CM

## 2022-02-22 DIAGNOSIS — J449 Chronic obstructive pulmonary disease, unspecified: Secondary | ICD-10-CM

## 2022-02-22 DIAGNOSIS — L409 Psoriasis, unspecified: Secondary | ICD-10-CM

## 2022-02-22 DIAGNOSIS — Z794 Long term (current) use of insulin: Secondary | ICD-10-CM | POA: Diagnosis not present

## 2022-02-22 DIAGNOSIS — Z23 Encounter for immunization: Secondary | ICD-10-CM | POA: Diagnosis not present

## 2022-02-22 DIAGNOSIS — Z124 Encounter for screening for malignant neoplasm of cervix: Secondary | ICD-10-CM

## 2022-02-22 DIAGNOSIS — E1159 Type 2 diabetes mellitus with other circulatory complications: Secondary | ICD-10-CM

## 2022-02-22 LAB — CBC WITH DIFFERENTIAL/PLATELET
Basophils Absolute: 0 10*3/uL (ref 0.0–0.1)
Basophils Relative: 0.5 % (ref 0.0–3.0)
Eosinophils Absolute: 0.2 10*3/uL (ref 0.0–0.7)
Eosinophils Relative: 2.9 % (ref 0.0–5.0)
HCT: 45.2 % (ref 36.0–46.0)
Hemoglobin: 15.4 g/dL — ABNORMAL HIGH (ref 12.0–15.0)
Lymphocytes Relative: 19.2 % (ref 12.0–46.0)
Lymphs Abs: 1.5 10*3/uL (ref 0.7–4.0)
MCHC: 34.1 g/dL (ref 30.0–36.0)
MCV: 95.9 fl (ref 78.0–100.0)
Monocytes Absolute: 0.5 10*3/uL (ref 0.1–1.0)
Monocytes Relative: 6.1 % (ref 3.0–12.0)
Neutro Abs: 5.4 10*3/uL (ref 1.4–7.7)
Neutrophils Relative %: 71.3 % (ref 43.0–77.0)
Platelets: 241 10*3/uL (ref 150.0–400.0)
RBC: 4.71 Mil/uL (ref 3.87–5.11)
RDW: 13 % (ref 11.5–15.5)
WBC: 7.5 10*3/uL (ref 4.0–10.5)

## 2022-02-22 LAB — MICROALBUMIN / CREATININE URINE RATIO
Creatinine,U: 38.3 mg/dL
Microalb Creat Ratio: 1.8 mg/g (ref 0.0–30.0)
Microalb, Ur: <0.7 mg/dL (ref 0.0–1.9)

## 2022-02-22 LAB — COMPREHENSIVE METABOLIC PANEL
ALT: 14 U/L (ref 0–35)
AST: 13 U/L (ref 0–37)
Albumin: 4.5 g/dL (ref 3.5–5.2)
Alkaline Phosphatase: 71 U/L (ref 39–117)
BUN: 11 mg/dL (ref 6–23)
CO2: 29 mEq/L (ref 19–32)
Calcium: 10.3 mg/dL (ref 8.4–10.5)
Chloride: 99 mEq/L (ref 96–112)
Creatinine, Ser: 0.8 mg/dL (ref 0.40–1.20)
GFR: 80.31 mL/min (ref >60.00)
Glucose, Bld: 181 mg/dL — ABNORMAL HIGH (ref 70–99)
Potassium: 3.8 mEq/L (ref 3.5–5.1)
Sodium: 138 mEq/L (ref 135–145)
Total Bilirubin: 0.8 mg/dL (ref 0.2–1.2)
Total Protein: 7.5 g/dL (ref 6.0–8.3)

## 2022-02-22 LAB — LIPID PANEL
Cholesterol: 149 mg/dL (ref 0–200)
HDL: 52.1 mg/dL (ref >39.00)
LDL Cholesterol: 76 mg/dL (ref 0–99)
NonHDL: 96.93
Total CHOL/HDL Ratio: 3
Triglycerides: 105 mg/dL (ref 0.0–149.0)
VLDL: 21 mg/dL (ref 0.0–40.0)

## 2022-02-22 LAB — POCT GLYCOSYLATED HEMOGLOBIN (HGB A1C): Hemoglobin A1C: 10.5 % — AB (ref 4.0–5.6)

## 2022-02-22 MED ORDER — FREESTYLE LIBRE 14 DAY SENSOR MISC: 5 refills | Status: DC

## 2022-02-22 MED ORDER — HYDROCHLOROTHIAZIDE 12.5 MG PO TABS: 12.5000 mg | ORAL_TABLET | Freq: Every day | ORAL | 1 refills | Status: DC

## 2022-02-22 MED ORDER — TIRZEPATIDE 2.5 MG/0.5ML ~~LOC~~ SOAJ: 2.5000 mg | SUBCUTANEOUS | 2 refills | Status: DC

## 2022-02-22 MED ORDER — PANTOPRAZOLE SODIUM 20 MG PO TBEC: DELAYED_RELEASE_TABLET | ORAL | 1 refills | Status: DC

## 2022-02-22 MED ORDER — EMPAGLIFLOZIN 10 MG PO TABS: 10.0000 mg | ORAL_TABLET | Freq: Every day | ORAL | 1 refills | Status: DC

## 2022-02-22 NOTE — Patient Instructions (Signed)
-  Nice seeing you today!!  -Lab work today; will notify you once results are available.  -Start Mounjaro 2.5 mg weekly.  -Schedule follow up in 3 months.

## 2022-02-22 NOTE — Progress Notes (Signed)
New Patient Office Visit     CC/Reason for Visit: Establish care, discuss chronic concerns, medication refills Previous PCP: Alysia Penna, NP Last Visit: Unknown  HPI: Judy Fischer is a 61 y.o. female who is coming in today for the above mentioned reasons. Past Medical History is significant for: Psoriasis, hypertension, diabetes.  She works in [employer], she has 3 brothers.  She is a chronic smoker of 1 pack a day for over 40 years, she drinks alcohol only occasionally.  She has no drug allergies although she did have significant GI upset with metformin.  Her past surgical history is significant for hysterectomy.  Her family history significant for father who died from mesothelioma and diabetes in mother and 2 brothers.  For her diabetes she has been on Jardiance 10 mg as well as Levemir 25 units at bedtime.  She quit taking Levemir a while back due to perceived side effects.  She has a significant history for plaque psoriasis, has not seen a dermatologist in years.  She is overdue for all age-appropriate cancer screenings.  She is due for shingles and RSV vaccines.   Past Medical/Surgical History: Past Medical History:  Diagnosis Date   Abnormal MRI of abdomen 08/13/2012   Tiny Liver lesions per [facility] records    Benign breast cyst in female    Need yearly mammograms, noted from [facility]    Bilateral breast cysts    benign, needs yearly mammograms, per [facility]    Diabetes [facility])    Diabetes mellitus without complication (HCC)    Fatty liver    Noted in records received from [facility]    GERD (gastroesophageal reflux disease)    Heart murmur    mild   Hepatic cyst    bening on MRI 2014, Per records from [place]    History of blood transfusion 1987   Hypertension    Obesity    Pneumonia    Psoriasis    Noted in records received from [facility]     Past Surgical History:  Procedure Laterality Date   ABDOMINAL  HYSTERECTOMY  2011   ovaries removed   COLONOSCOPY WITH PROPOFOL N/A 06/26/2012   Procedure: COLONOSCOPY WITH PROPOFOL;  Surgeon: Charolett Bumpers, MD;  Location: [facility];  Service: Endoscopy;  Laterality: N/A;   surgery for ectopic pregnancy  1987   TONSILLECTOMY  as child    Social History:  reports that she has been smoking cigarettes. She has a 35.00 pack-year smoking history. She has never used smokeless tobacco. She reports that she does not drink alcohol and does not use drugs.  Allergies: Allergies  Allergen Reactions   Metformin And Related Nausea Only    Family History:  Family History  Problem Relation Age of Onset   Diabetes Other    Hypertension Other    Diabetes Mother    Hypertension Mother    Heart disease Mother    Cancer Father    Diabetes Brother    Diabetes Brother      Current Outpatient Medications:    Albuterol Sulfate (PROAIR RESPICLICK) 108 (90 Base) MCG/ACT AEPB, Inhale 1 puff into the lungs every 6 (six) hours as needed., Disp: 1 each, Rfl: 1   aspirin 81 MG tablet, Take 81 mg by mouth daily., Disp: , Rfl:    B-D ULTRAFINE III SHORT PEN 31G X 8 MM MISC, USE AS DIRECTED WITH LEVEMIR PEN, Disp: 100 each, Rfl: 3   Continuous Blood  Gluc Receiver (FREESTYLE LIBRE 14 DAY READER) DEVI, 1 Units by Does not apply route once a week., Disp: 1 Device, Rfl: 0   diphenhydramine-acetaminophen (TYLENOL PM) 25-500 MG TABS tablet, Take 1 tablet by mouth at bedtime as needed (sleep). , Disp: , Rfl:    fluticasone (FLONASE) 50 MCG/ACT nasal spray, Place 1 spray into both nostrils daily., Disp: 16 g, Rfl: 3   insulin detemir (LEVEMIR FLEXPEN) 100 UNIT/ML FlexPen, Inject 10 Units into the skin daily. (Patient taking differently: Inject 25 Units into the skin daily.), Disp: 15 mL, Rfl: 11   SYMBICORT 160-4.5 MCG/ACT inhaler, Inhale 2 puffs into the lungs 2 (two) times daily. FOR COPD, Disp: 1 each, Rfl: 5   tirzepatide (MOUNJARO) 2.5 MG/0.5ML Pen, Inject 2.5 mg into  the skin once a week., Disp: 2 mL, Rfl: 2   Continuous Blood Gluc Sensor (FREESTYLE LIBRE 14 DAY SENSOR) MISC, USE EVERY 14 DAYS AS DIRECTED Dx E11.9, Disp: 1 each, Rfl: 5   empagliflozin (JARDIANCE) 10 MG TABS tablet, Take 1 tablet (10 mg total) by mouth daily., Disp: 90 tablet, Rfl: 1   hydrochlorothiazide (HYDRODIURIL) 12.5 MG tablet, Take 1 tablet (12.5 mg total) by mouth daily., Disp: 90 tablet, Rfl: 1   pantoprazole (PROTONIX) 20 MG tablet, TAKE 1 TABLET BY MOUTH EVERY DAY BEFORE BREAKFAST, Disp: 90 tablet, Rfl: 1  Current Facility-Administered Medications:    ipratropium-albuterol (DUONEB) 0.5-2.5 (3) MG/3ML nebulizer solution 3 mL, 3 mL, Nebulization, Q6H, Nche, Bonna Gains, NP, 3 mL at 04/16/18 0946  Review of Systems:  Constitutional: Denies fever, chills, diaphoresis, appetite change and fatigue.  HEENT: Denies photophobia, eye pain, redness, hearing loss, ear pain, congestion, sore throat, rhinorrhea, sneezing, mouth sores, trouble swallowing, neck pain, neck stiffness and tinnitus.   Respiratory: Denies SOB, DOE, cough, chest tightness,  and wheezing.   Cardiovascular: Denies chest pain, palpitations and leg swelling.  Gastrointestinal: Denies nausea, vomiting, abdominal pain, diarrhea, constipation, blood in stool and abdominal distention.  Genitourinary: Denies dysuria, urgency, frequency, hematuria, flank pain and difficulty urinating.  Endocrine: Denies: hot or cold intolerance, sweats, changes in hair or nails, polyuria, polydipsia. Musculoskeletal: Denies myalgias, back pain, joint swelling, arthralgias and gait problem.  Neurological: Denies dizziness, seizures, syncope, weakness, light-headedness, numbness and headaches.  Hematological: Denies adenopathy. Easy bruising, personal or family bleeding history  Psychiatric/Behavioral: Denies suicidal ideation, mood changes, confusion, nervousness, sleep disturbance and agitation    Physical Exam: Vitals:   02/22/22 0956   BP: 110/80  Pulse: 67  Temp: 98.8 F (37.1 C)  TempSrc: Oral  Weight: 217 lb 8 oz (98.7 kg)  Height: 5\' 3"  (1.6 m)   Body mass index is 38.53 kg/m.   Constitutional: NAD, calm, comfortable Eyes: PERRL, lids and conjunctivae normal ENMT: Mucous membranes are moist. Respiratory: clear to auscultation bilaterally, no wheezing, no crackles. Normal respiratory effort. No accessory muscle use.  Cardiovascular: Regular rate and rhythm, no murmurs / rubs / gallops. No extremity edema.  Psychiatric: Normal judgment and insight. Alert and oriented x 3. Normal mood.    Impression and Plan:  Type 2 diabetes mellitus without complication, with long-term current use of insulin (HCC) - Plan: POCT glycosylated hemoglobin (Hb A1C), Continuous Blood Gluc Sensor (FREESTYLE LIBRE 14 DAY SENSOR) MISC, empagliflozin (JARDIANCE) 10 MG TABS tablet, CBC with Differential/Platelet, Comprehensive metabolic panel, Lipid panel, Microalbumin / creatinine urine ratio, tirzepatide (MOUNJARO) 2.5 MG/0.5ML Pen, Microalbumin / creatinine urine ratio, Lipid panel, Comprehensive metabolic panel, CBC with Differential/Platelet, Ambulatory referral to Ophthalmology  Hypertension  associated with diabetes (HCC) - Plan: hydrochlorothiazide (HYDRODIURIL) 12.5 MG tablet  Gastroesophageal reflux disease without esophagitis - Plan: pantoprazole (PROTONIX) 20 MG tablet  Cigarette nicotine dependence without complication - Plan: Ambulatory Referral Lung Cancer Screening [facility]  Need for shingles vaccine - Plan: Zoster Recombinant (Shingrix )  Psoriasis - Plan: Ambulatory referral to Dermatology  Chronic obstructive pulmonary disease, unspecified COPD type (HCC)  Screening breast examination - Plan: MM Digital Screening  Screening for malignant neoplasm of cervix - Plan: Ambulatory referral to Gynecology  Screening for malignant neoplasm of colon - Plan: Ambulatory referral to Gastroenterology  -First shingles  vaccine administered in office today, advised to update RSV at pharmacy.  -Referral to dermatology placed for her advanced plaque psoriasis. -I have placed orders to update all cancer screening including mammogram, colonoscopy, Pap smear. -Referral to ophthalmology for diabetic eye exam. -A1c of 10.5 shows significantly uncontrolled diabetes.  I will plan to continue Jardiance 10 mg.  Will start Mounjaro 2.5 mg injected weekly.  She has tried metformin in the past and has significant stomach upset and diarrhea with it so this is not a viable option at this point.  She will return in 3 months for follow-up.    Patient Instructions  -Nice seeing you today!!  -Lab work today; will notify you once results are available.  -Start Mounjaro 2.5 mg weekly.  -Schedule follow up in 3 months.    Chaya Jan, MD [facility] at [facility]

## 2022-02-24 ENCOUNTER — Other Ambulatory Visit: Payer: Self-pay | Admitting: *Deleted

## 2022-02-24 ENCOUNTER — Other Ambulatory Visit: Payer: Self-pay | Admitting: Internal Medicine

## 2022-02-24 ENCOUNTER — Encounter: Payer: Self-pay | Admitting: Internal Medicine

## 2022-02-24 DIAGNOSIS — E1169 Type 2 diabetes mellitus with other specified complication: Secondary | ICD-10-CM

## 2022-02-24 MED ORDER — ATORVASTATIN CALCIUM 20 MG PO TABS: 20.0000 mg | ORAL_TABLET | Freq: Every day | ORAL | 1 refills | Status: DC

## 2022-04-20 ENCOUNTER — Ambulatory Visit
Admission: RE | Admit: 2022-04-20 | Discharge: 2022-04-20 | Disposition: A | Discharge status: Home / Self Care | Payer: 59 | Source: Ambulatory Visit | Attending: Internal Medicine | Admitting: Internal Medicine

## 2022-04-20 DIAGNOSIS — Z1239 Encounter for other screening for malignant neoplasm of breast: Secondary | ICD-10-CM

## 2022-05-16 ENCOUNTER — Other Ambulatory Visit: Payer: Self-pay | Admitting: Internal Medicine

## 2022-05-16 DIAGNOSIS — Z794 Long term (current) use of insulin: Secondary | ICD-10-CM

## 2022-05-18 ENCOUNTER — Other Ambulatory Visit: Payer: Self-pay | Admitting: *Deleted

## 2022-05-18 DIAGNOSIS — Z122 Encounter for screening for malignant neoplasm of respiratory organs: Secondary | ICD-10-CM

## 2022-05-18 DIAGNOSIS — F17210 Nicotine dependence, cigarettes, uncomplicated: Secondary | ICD-10-CM

## 2022-05-18 DIAGNOSIS — Z87891 Personal history of nicotine dependence: Secondary | ICD-10-CM

## 2022-05-24 ENCOUNTER — Ambulatory Visit (INDEPENDENT_AMBULATORY_CARE_PROVIDER_SITE_OTHER): Payer: 59 | Admitting: Internal Medicine

## 2022-05-24 ENCOUNTER — Encounter: Payer: Self-pay | Admitting: Internal Medicine

## 2022-05-24 VITALS — BP 112/80 | HR 80 | Temp 98.6°F | Wt 214.4 lb

## 2022-05-24 DIAGNOSIS — E1159 Type 2 diabetes mellitus with other circulatory complications: Secondary | ICD-10-CM | POA: Diagnosis not present

## 2022-05-24 DIAGNOSIS — Z23 Encounter for immunization: Secondary | ICD-10-CM

## 2022-05-24 DIAGNOSIS — E1169 Type 2 diabetes mellitus with other specified complication: Secondary | ICD-10-CM | POA: Diagnosis not present

## 2022-05-24 DIAGNOSIS — I152 Hypertension secondary to endocrine disorders: Secondary | ICD-10-CM

## 2022-05-24 DIAGNOSIS — Z1211 Encounter for screening for malignant neoplasm of colon: Secondary | ICD-10-CM

## 2022-05-24 DIAGNOSIS — K219 Gastro-esophageal reflux disease without esophagitis: Secondary | ICD-10-CM | POA: Diagnosis not present

## 2022-05-24 DIAGNOSIS — Z794 Long term (current) use of insulin: Secondary | ICD-10-CM

## 2022-05-24 DIAGNOSIS — E785 Hyperlipidemia, unspecified: Secondary | ICD-10-CM

## 2022-05-24 LAB — POCT GLYCOSYLATED HEMOGLOBIN (HGB A1C): Hemoglobin A1C: 6.3 % — AB (ref 4.0–5.6)

## 2022-05-24 MED ORDER — FREESTYLE LIBRE 14 DAY SENSOR MISC: 6 refills | Status: DC

## 2022-05-24 MED ORDER — TIRZEPATIDE 5 MG/0.5ML ~~LOC~~ SOAJ: 5.0000 mg | SUBCUTANEOUS | 0 refills | Status: DC

## 2022-05-24 NOTE — Patient Instructions (Signed)
-  Nice seeing you today!!  -Lab work today; will notify you once results are available.  -Second shingles vaccine today.  -Schedule follow up in 3 months.  -Increase Mounjaro to 5 mg weekly.

## 2022-05-24 NOTE — Progress Notes (Signed)
Established Patient Office Visit     CC/Reason for Visit: 12-month follow-up chronic conditions  HPI: Judy Fischer is a 62 y.o. female who is coming in today for the above mentioned reasons. Past Medical History is significant for: Hypertension, hyperlipidemia, type 2 diabetes, psoriasis.  She is doing well and has no acute concerns.  She tolerated starting dose of Mounjaro well.  She is scheduled for her lung cancer screening CT scan beginning of April.  She had her mammogram and eye exam.  She is now due for her repeat colonoscopy.  She is requesting second shingles vaccine.   Past Medical/Surgical History: Past Medical History:  Diagnosis Date   Abnormal MRI of abdomen 08/13/2012   Tiny Liver lesions per [facility] records    Benign breast cyst in female    Need yearly mammograms, noted from [place] Records    Bilateral breast cysts    benign, needs yearly mammograms, per [facility]    Diabetes [facility])    Diabetes mellitus without complication ([place])    Fatty liver    Noted in records received from [place]    GERD (gastroesophageal reflux disease)    Heart murmur    mild   Hepatic cyst    bening on MRI 2014, Per records from [place]    History of blood transfusion 1987   Hypertension    Obesity    Pneumonia    Psoriasis    Noted in records received from [place]     Past Surgical History:  Procedure Laterality Date   ABDOMINAL HYSTERECTOMY  2011   ovaries removed   COLONOSCOPY WITH PROPOFOL N/A 06/26/2012   Procedure: COLONOSCOPY WITH PROPOFOL;  Surgeon: Garlan Fair, MD;  Location: [facility];  Service: Endoscopy;  Laterality: N/A;   surgery for ectopic pregnancy  1987   TONSILLECTOMY  as child    Social History:  reports that she has been smoking cigarettes. She has a 35.00 pack-year smoking history. She has never used smokeless tobacco. She reports that she does not drink alcohol and does not use  drugs.  Allergies: Allergies  Allergen Reactions   Metformin And Related Nausea Only    Family History:  Family History  Problem Relation Age of Onset   Diabetes Other    Hypertension Other    Diabetes Mother    Hypertension Mother    Heart disease Mother    Cancer Father    Diabetes Brother    Diabetes Brother      Current Outpatient Medications:    Albuterol Sulfate (PROAIR RESPICLICK) [id number] (90 Base) MCG/ACT AEPB, Inhale 1 puff into the lungs every 6 (six) hours as needed., Disp: 1 each, Rfl: 1   aspirin 81 MG tablet, Take 81 mg by mouth daily., Disp: , Rfl:    atorvastatin (LIPITOR) 20 MG tablet, Take 1 tablet (20 mg total) by mouth daily., Disp: 90 tablet, Rfl: 1   Continuous Blood Gluc Receiver (FREESTYLE LIBRE 14 DAY READER) DEVI, 1 Units by Does not apply route once a week., Disp: 1 Device, Rfl: 0   diphenhydramine-acetaminophen (TYLENOL PM) 25-500 MG TABS tablet, Take 1 tablet by mouth at bedtime as needed (sleep). , Disp: , Rfl:    empagliflozin (JARDIANCE) 10 MG TABS tablet, Take 1 tablet (10 mg total) by mouth daily., Disp: 90 tablet, Rfl: 1   fluticasone (FLONASE) 50 MCG/ACT nasal spray, Place 1 spray into both nostrils daily., Disp: 16 g, Rfl: 3  hydrochlorothiazide (HYDRODIURIL) 12.5 MG tablet, Take 1 tablet (12.5 mg total) by mouth daily., Disp: 90 tablet, Rfl: 1   pantoprazole (PROTONIX) 20 MG tablet, TAKE 1 TABLET BY MOUTH EVERY DAY BEFORE BREAKFAST, Disp: 90 tablet, Rfl: 1   SYMBICORT 160-4.5 MCG/ACT inhaler, Inhale 2 puffs into the lungs 2 (two) times daily. FOR COPD, Disp: 1 each, Rfl: 5   tirzepatide (MOUNJARO) 5 MG/0.5ML Pen, Inject 5 mg into the skin once a week., Disp: 6 mL, Rfl: 0   Continuous Blood Gluc Sensor (FREESTYLE LIBRE 14 DAY SENSOR) MISC, USE EVERY 14 DAYS AS DIRECTED Dx E11.9, Disp: 2 each, Rfl: 6  Current Facility-Administered Medications:    ipratropium-albuterol (DUONEB) 0.5-2.5 (3) MG/3ML nebulizer solution 3 mL, 3 mL, Nebulization, Q6H,  Nche, Charlene Brooke, NP, 3 mL at 04/16/18 0946  Review of Systems:  Negative unless indicated in HPI.   Physical Exam: Vitals:   05/24/22 0704  BP: 112/80  Pulse: 80  Temp: 98.6 F (37 C)  TempSrc: Oral  SpO2: 95%  Weight: 214 lb 6.4 oz (97.3 kg)    Body mass index is 37.98 kg/m.   Physical Exam Vitals reviewed.  Constitutional:      Appearance: Normal appearance.  HENT:     Head: Normocephalic and atraumatic.  Eyes:     Conjunctiva/sclera: Conjunctivae normal.     Pupils: Pupils are equal, round, and reactive to light.  Cardiovascular:     Rate and Rhythm: Normal rate and regular rhythm.  Pulmonary:     Effort: Pulmonary effort is normal.     Breath sounds: Normal breath sounds.  Skin:    General: Skin is warm and dry.  Neurological:     General: No focal deficit present.     Mental Status: She is alert and oriented to person, place, and time.  Psychiatric:        Mood and Affect: Mood normal.        Behavior: Behavior normal.        Thought Content: Thought content normal.        Judgment: Judgment normal.     Diabetic Foot Exam - Simple   Simple Foot Form Diabetic Foot exam was performed with the following findings: Yes 05/24/2022  7:23 AM  Visual Inspection No deformities, no ulcerations, no other skin breakdown bilaterally: Yes Sensation Testing Intact to touch and monofilament testing bilaterally: Yes Pulse Check Posterior Tibialis and Dorsalis pulse intact bilaterally: Yes Comments      Impression and Plan:  Type 2 diabetes mellitus with other specified complication, with long-term current use of insulin (HCC) - Plan: POC HgB A1c, Continuous Blood Gluc Sensor (FREESTYLE LIBRE 14 DAY SENSOR) MISC, tirzepatide [facility]) 5 MG/0.5ML Pen  Hyperlipidemia associated with type 2 diabetes mellitus ([place])  Hypertension associated with diabetes ([place])  Gastroesophageal reflux disease without esophagitis  Immunization due  -Second and final shingles  vaccine today. -A1c has significantly decreased from 10.5 in December down to 6.3 today.  She is on Jardiance 10 mg and Mounjaro 2.5 mg.  Increase Mounjaro to 5 mg weekly.  She is tolerating it well. -LDL was close to goal at 76, she is on atorvastatin 20 mg.  Recheck lipids today. -Blood pressure is well-controlled.  Time spent:32 minutes reviewing chart, interviewing and examining patient and formulating plan of care.     Lelon Frohlich, MD [facility] at [facility]

## 2022-05-24 NOTE — Addendum Note (Signed)
Addended by: Westley Hummer B on: 05/24/2022 08:34 AM   Modules accepted: Orders

## 2022-06-08 ENCOUNTER — Ambulatory Visit (INDEPENDENT_AMBULATORY_CARE_PROVIDER_SITE_OTHER): Payer: 59 | Admitting: Acute Care

## 2022-06-08 ENCOUNTER — Encounter: Payer: Self-pay | Admitting: Acute Care

## 2022-06-08 DIAGNOSIS — F17210 Nicotine dependence, cigarettes, uncomplicated: Secondary | ICD-10-CM

## 2022-06-08 NOTE — Progress Notes (Signed)
Virtual Visit via Telephone Note  I connected with Judy Fischer on 06/08/22 at  9:30 AM EDT by telephone and verified that I am speaking with the correct person using two identifiers.  Location: Patient:  At home Provider:  [place], [place], [place], [address]    I discussed the limitations, risks, security and privacy concerns of performing an evaluation and management service by telephone and the availability of in person appointments. I also discussed with the patient that there may be a patient responsible charge related to this service. The patient expressed understanding and agreed to proceed.   Shared Decision Making Visit Lung Cancer Screening Program [phone])   Eligibility: Age 62 y.o. Pack Years Smoking History Calculation 43 pack year smoking history (# packs/per year x # years smoked) Recent History of coughing up blood  no Unexplained weight loss? no ( >Than 15 pounds within the last 6 months ) Prior History Lung / other cancer no (Diagnosis within the last 5 years already requiring surveillance chest CT Scans). Smoking Status Current Smoker Former Smokers: Years since quit:  NA  Quit Date:  NA  Visit Components: Discussion included one or more decision making aids. yes Discussion included risk/benefits of screening. yes Discussion included potential follow up diagnostic testing for abnormal scans. yes Discussion included meaning and risk of over diagnosis. yes Discussion included meaning and risk of False Positives. yes Discussion included meaning of total radiation exposure. yes  Counseling Included: Importance of adherence to annual lung cancer LDCT screening. yes Impact of comorbidities on ability to participate in the program. yes Ability and willingness to under diagnostic treatment. yes  Smoking Cessation Counseling: Current Smokers:  Discussed importance of smoking cessation. yes Information about tobacco cessation classes and  interventions provided to patient. yes Patient provided with "ticket" for LDCT Scan. yes Symptomatic Patient. no  Counseling NA Diagnosis Code: Tobacco Use Z72.0 Asymptomatic Patient yes  Counseling (Intermediate counseling: > three minutes counseling) [id number] Former Smokers:  Discussed the importance of maintaining cigarette abstinence. yes Diagnosis Code: Personal History of Nicotine Dependence. [id number] Information about tobacco cessation classes and interventions provided to patient. Yes Patient provided with "ticket" for LDCT Scan. yes Written Order for Lung Cancer Screening with LDCT placed in Epic. Yes (CT Chest Lung Cancer Screening Low Dose W/O CM) [id number] Z12.2-Screening of respiratory organs Z87.891-Personal history of nicotine dependence  I have spent 25 minutes of face to face/ virtual visit   time with  Judy Fischer discussing the risks and benefits of lung cancer screening. We viewed / discussed a power point together that explained in detail the above noted topics. We paused at intervals to allow for questions to be asked and answered to ensure understanding.We discussed that the single most powerful action that she can take to decrease her risk of developing lung cancer is to quit smoking. We discussed whether or not she is ready to commit to setting a quit date. We discussed options for tools to aid in quitting smoking including nicotine replacement therapy, non-nicotine medications, support groups, Quit Smart classes, and behavior modification. We discussed that often times setting smaller, more achievable goals, such as eliminating 1 cigarette a day for a week and then 2 cigarettes a day for a week can be helpful in slowly decreasing the number of cigarettes smoked. This allows for a sense of accomplishment as well as providing a clinical benefit. I provided  her  with smoking cessation  information  with contact information for community resources,  classes, free nicotine replacement  therapy, and access to mobile apps, text messaging, and on-line smoking cessation help. I have also provided  her  the office contact information in the event she needs to contact me, or the screening staff. We discussed the time and location of the scan, and that either Doroteo Glassman RN, Joella Prince, RN  or I will call / send a letter with the results within 24-72 hours of receiving them. The patient verbalized understanding of all of  the above and had no further questions upon leaving the office. They have my contact information in the event they have any further questions.  I spent 3 minutes counseling on smoking cessation and the health risks of continued tobacco abuse.  I explained to the patient that there has been a high incidence of coronary artery disease noted on these exams. I explained that this is a non-gated exam therefore degree or severity cannot be determined. This patient is on statin therapy. I have asked the patient to follow-up with their PCP regarding any incidental finding of coronary artery disease and management with diet or medication as their PCP  feels is clinically indicated. The patient verbalized understanding of the above and had no further questions upon completion of the visit.      Magdalen Spatz, NP 06/08/2022

## 2022-06-08 NOTE — Patient Instructions (Signed)
Thank you for participating in the [facility]. It was our pleasure to meet you today. We will call you with the results of your scan within the next few days. Your scan will be assigned a Lung RADS category score by the physicians reading the scans.  This Lung RADS score determines follow up scanning.  See below for description of categories, and follow up screening recommendations. We will be in touch to schedule your follow up screening annually or based on recommendations of our providers. We will fax a copy of your scan results to your Primary Care Physician, or the physician who referred you to the program, to ensure they have the results. Please call the office if you have any questions or concerns regarding your scanning experience or results.  Our office number is [phone]. Please speak with Denise Phelps, RN. , or  Denise Buckner RN, They are  our Lung Cancer Screening RN.'s If They are unavailable when you call, Please leave a message on the voice mail. We will return your call at our earliest convenience.This voice mail is monitored several times a day.  Remember, if your scan is normal, we will scan you annually as long as you continue to meet the criteria for the program. (Age 50-80, Current smoker or smoker who has quit within the last 15 years). If you are a smoker, remember, quitting is the single most powerful action that you can take to decrease your risk of lung cancer and other pulmonary, breathing related problems. We know quitting is hard, and we are here to help.  Please let us know if there is anything we can do to help you meet your goal of quitting. If you are a former smoker, congratulations. We are proud of you! Remain smoke free! Remember you can refer friends or family members through the number above.  We will screen them to make sure they meet criteria for the program. Thank you for helping us take better care of you by  participating in Lung Screening.  You can receive free nicotine replacement therapy ( patches, gum or mints) by calling 1-800-QUIT NOW. Please call so we can get you on the path to becoming  a non-smoker. I know it is hard, but you can do this!  Lung RADS Categories:  Lung RADS 1: no nodules or definitely non-concerning nodules.  Recommendation is for a repeat annual scan in 12 months.  Lung RADS 2:  nodules that are non-concerning in appearance and behavior with a very low likelihood of becoming an active cancer. Recommendation is for a repeat annual scan in 12 months.  Lung RADS 3: nodules that are probably non-concerning , includes nodules with a low likelihood of becoming an active cancer.  Recommendation is for a 6-month repeat screening scan. Often noted after an upper respiratory illness. We will be in touch to make sure you have no questions, and to schedule your 6-month scan.  Lung RADS 4 A: nodules with concerning findings, recommendation is most often for a follow up scan in 3 months or additional testing based on our provider's assessment of the scan. We will be in touch to make sure you have no questions and to schedule the recommended 3 month follow up scan.  Lung RADS 4 B:  indicates findings that are concerning. We will be in touch with you to schedule additional diagnostic testing based on our provider's  assessment of the scan.  Other options for assistance in smoking cessation (   As covered by your insurance benefits)  Hypnosis for smoking cessation  [employer] [phone]  Acupuncture for smoking cessation  [facility] [phone]   

## 2022-06-10 ENCOUNTER — Ambulatory Visit
Admission: RE | Admit: 2022-06-10 | Discharge: 2022-06-10 | Disposition: A | Discharge status: Home / Self Care | Payer: 59 | Source: Ambulatory Visit | Attending: Acute Care | Admitting: Acute Care

## 2022-06-10 DIAGNOSIS — F17210 Nicotine dependence, cigarettes, uncomplicated: Secondary | ICD-10-CM

## 2022-06-10 DIAGNOSIS — Z122 Encounter for screening for malignant neoplasm of respiratory organs: Secondary | ICD-10-CM

## 2022-06-10 DIAGNOSIS — Z87891 Personal history of nicotine dependence: Secondary | ICD-10-CM

## 2022-06-13 ENCOUNTER — Other Ambulatory Visit: Payer: Self-pay

## 2022-06-13 DIAGNOSIS — Z122 Encounter for screening for malignant neoplasm of respiratory organs: Secondary | ICD-10-CM

## 2022-06-13 DIAGNOSIS — Z87891 Personal history of nicotine dependence: Secondary | ICD-10-CM

## 2022-06-13 DIAGNOSIS — F17210 Nicotine dependence, cigarettes, uncomplicated: Secondary | ICD-10-CM

## 2022-07-05 ENCOUNTER — Telehealth: Payer: Self-pay | Admitting: Internal Medicine

## 2022-07-05 DIAGNOSIS — Z1211 Encounter for screening for malignant neoplasm of colon: Secondary | ICD-10-CM

## 2022-07-05 NOTE — Telephone Encounter (Signed)
Shanda Bumps called the pt and pt has history with [facility]

## 2022-07-05 NOTE — Telephone Encounter (Signed)
New referral placed.

## 2022-08-12 ENCOUNTER — Other Ambulatory Visit: Payer: Self-pay | Admitting: Internal Medicine

## 2022-08-12 DIAGNOSIS — E119 Type 2 diabetes mellitus without complications: Secondary | ICD-10-CM

## 2022-08-12 DIAGNOSIS — K219 Gastro-esophageal reflux disease without esophagitis: Secondary | ICD-10-CM

## 2022-08-12 DIAGNOSIS — E1169 Type 2 diabetes mellitus with other specified complication: Secondary | ICD-10-CM

## 2022-08-12 DIAGNOSIS — E1159 Type 2 diabetes mellitus with other circulatory complications: Secondary | ICD-10-CM

## 2022-08-23 ENCOUNTER — Ambulatory Visit (INDEPENDENT_AMBULATORY_CARE_PROVIDER_SITE_OTHER): Payer: 59 | Admitting: Internal Medicine

## 2022-08-23 ENCOUNTER — Encounter: Payer: Self-pay | Admitting: Internal Medicine

## 2022-08-23 VITALS — BP 127/71 | HR 76 | Temp 98.7°F | Wt 217.2 lb

## 2022-08-23 DIAGNOSIS — I152 Hypertension secondary to endocrine disorders: Secondary | ICD-10-CM

## 2022-08-23 DIAGNOSIS — E1169 Type 2 diabetes mellitus with other specified complication: Secondary | ICD-10-CM

## 2022-08-23 DIAGNOSIS — Z794 Long term (current) use of insulin: Secondary | ICD-10-CM

## 2022-08-23 DIAGNOSIS — M5442 Lumbago with sciatica, left side: Secondary | ICD-10-CM | POA: Diagnosis not present

## 2022-08-23 DIAGNOSIS — Z1211 Encounter for screening for malignant neoplasm of colon: Secondary | ICD-10-CM

## 2022-08-23 DIAGNOSIS — E1159 Type 2 diabetes mellitus with other circulatory complications: Secondary | ICD-10-CM | POA: Diagnosis not present

## 2022-08-23 DIAGNOSIS — E785 Hyperlipidemia, unspecified: Secondary | ICD-10-CM

## 2022-08-23 LAB — LIPID PANEL
Cholesterol: 91 mg/dL (ref 0–200)
HDL: 39.2 mg/dL (ref >39.00)
LDL Cholesterol: 37 mg/dL (ref 0–99)
NonHDL: 52.06
Total CHOL/HDL Ratio: 2
Triglycerides: 74 mg/dL (ref 0.0–149.0)
VLDL: 14.8 mg/dL (ref 0.0–40.0)

## 2022-08-23 LAB — HEMOGLOBIN A1C: Hgb A1c MFr Bld: 5.6 % (ref 4.6–6.5)

## 2022-08-23 LAB — POCT GLYCOSYLATED HEMOGLOBIN (HGB A1C): Hemoglobin A1C: 5.7 % — AB (ref 4.0–5.6)

## 2022-08-23 NOTE — Assessment & Plan Note (Signed)
Well-controlled with an A1c of 5.7.

## 2022-08-23 NOTE — Assessment & Plan Note (Signed)
Well controlled on current

## 2022-08-23 NOTE — Progress Notes (Signed)
Established Patient Office Visit     CC/Reason for Visit: Follow-up chronic medical conditions  HPI: Judy Fischer is a 62 y.o. female who is coming in today for the above mentioned reasons. Past Medical History is significant for: Hypertension, hyperlipidemia, type 2 diabetes, psoriasis followed by dermatology.  For the last 6 weeks or so she has been experiencing left lower back pain with pain radiating down her buttock outside of her thigh.  She has been able to remain active throughout this.  Last visit she was started on atorvastatin 20 mg for suboptimal LDL and is due for recheck today.   Past Medical/Surgical History: Past Medical History:  Diagnosis Date   Abnormal MRI of abdomen 08/13/2012   Tiny Liver lesions per [facility] records    Benign breast cyst in female    Need yearly mammograms, noted from [facility]    Bilateral breast cysts    benign, needs yearly mammograms, per [facility]    Diabetes [facility])    Diabetes mellitus without complication (HCC)    Fatty liver    Noted in records received from [facility]    GERD (gastroesophageal reflux disease)    Heart murmur    mild   Hepatic cyst    bening on MRI 2014, Per records from [place]    History of blood transfusion 1987   Hypertension    Obesity    Pneumonia    Psoriasis    Noted in records received from [facility]     Past Surgical History:  Procedure Laterality Date   ABDOMINAL HYSTERECTOMY  2011   ovaries removed   COLONOSCOPY WITH PROPOFOL N/A 06/26/2012   Procedure: COLONOSCOPY WITH PROPOFOL;  Surgeon: Charolett Bumpers, MD;  Location: [facility];  Service: Endoscopy;  Laterality: N/A;   surgery for ectopic pregnancy  1987   TONSILLECTOMY  as child    Social History:  reports that she has been smoking cigarettes. She has a 43.00 pack-year smoking history. She has never used smokeless tobacco. She reports that she does not drink alcohol and does not use  drugs.  Allergies: Allergies  Allergen Reactions   Metformin And Related Nausea Only    Family History:  Family History  Problem Relation Age of Onset   Diabetes Other    Hypertension Other    Diabetes Mother    Hypertension Mother    Heart disease Mother    Cancer Father    Diabetes Brother    Diabetes Brother      Current Outpatient Medications:    Albuterol Sulfate (PROAIR RESPICLICK) 108 (90 Base) MCG/ACT AEPB, Inhale 1 puff into the lungs every 6 (six) hours as needed., Disp: 1 each, Rfl: 1   aspirin 81 MG tablet, Take 81 mg by mouth daily., Disp: , Rfl:    atorvastatin (LIPITOR) 20 MG tablet, TAKE 1 TABLET BY MOUTH EVERY DAY, Disp: 90 tablet, Rfl: 1   Continuous Blood Gluc Receiver (FREESTYLE LIBRE 14 DAY READER) DEVI, 1 Units by Does not apply route once a week., Disp: 1 Device, Rfl: 0   Continuous Blood Gluc Sensor (FREESTYLE LIBRE 14 DAY SENSOR) MISC, USE EVERY 14 DAYS AS DIRECTED Dx E11.9, Disp: 2 each, Rfl: 6   diphenhydramine-acetaminophen (TYLENOL PM) 25-500 MG TABS tablet, Take 1 tablet by mouth at bedtime as needed (sleep). , Disp: , Rfl:    empagliflozin (JARDIANCE) 10 MG TABS tablet, TAKE 1 TABLET BY MOUTH EVERY DAY, Disp: 90 tablet, Rfl:  1   fluticasone (FLONASE) 50 MCG/ACT nasal spray, Place 1 spray into both nostrils daily., Disp: 16 g, Rfl: 3   hydrochlorothiazide (HYDRODIURIL) 12.5 MG tablet, TAKE 1 TABLET BY MOUTH EVERY DAY, Disp: 90 tablet, Rfl: 1   pantoprazole (PROTONIX) 20 MG tablet, TAKE 1 TABLET BY MOUTH EVERY DAY BEFORE BREAKFAST, Disp: 90 tablet, Rfl: 1   SYMBICORT 160-4.5 MCG/ACT inhaler, Inhale 2 puffs into the lungs 2 (two) times daily. FOR COPD, Disp: 1 each, Rfl: 5   tirzepatide (MOUNJARO) 5 MG/0.5ML Pen, Inject 5 mg into the skin once a week., Disp: 6 mL, Rfl: 0  Current Facility-Administered Medications:    ipratropium-albuterol (DUONEB) 0.5-2.5 (3) MG/3ML nebulizer solution 3 mL, 3 mL, Nebulization, Q6H, Nche, Bonna Gains, NP, 3 mL at  04/16/18 0946  Review of Systems:  Negative unless indicated in HPI.   Physical Exam: Vitals:   08/23/22 0700  BP: 127/71  Pulse: 76  Temp: 98.7 F (37.1 C)  TempSrc: Oral  SpO2: 96%  Weight: 217 lb 3.2 oz (98.5 kg)    Body mass index is 38.48 kg/m.   Physical Exam Vitals reviewed.  Constitutional:      Appearance: Normal appearance.  HENT:     Head: Normocephalic and atraumatic.  Eyes:     Conjunctiva/sclera: Conjunctivae normal.     Pupils: Pupils are equal, round, and reactive to light.  Cardiovascular:     Rate and Rhythm: Normal rate and regular rhythm.  Pulmonary:     Effort: Pulmonary effort is normal.     Breath sounds: Normal breath sounds.  Skin:    General: Skin is warm and dry.  Neurological:     General: No focal deficit present.     Mental Status: She is alert and oriented to person, place, and time.  Psychiatric:        Mood and Affect: Mood normal.        Behavior: Behavior normal.        Thought Content: Thought content normal.        Judgment: Judgment normal.      Impression and Plan:  Type 2 diabetes mellitus with other specified complication, with long-term current use of insulin (HCC) Assessment & Plan: Well-controlled with an A1c of 5.7.  Orders: -     Hemoglobin A1c; Future -     POCT glycosylated hemoglobin (Hb A1C)  Screening for malignant neoplasm of colon -     Ambulatory referral to Gastroenterology  Hyperlipidemia associated with type 2 diabetes mellitus (HCC) Assessment & Plan: Atorvastatin 20 mg started at last visit, recheck lipids today.  Goal LDL is less than 70.  Orders: -     Lipid panel; Future  Hypertension associated with diabetes [facility]) Assessment & Plan: Well-controlled on current.   Acute left-sided low back pain with left-sided sciatica  -Suspect this represents sciatica.   -Advised icing, as needed NSAIDs, back stretches, local massage therapy. -If no improvement in 3 to 4 weeks, can consider  referral to physical therapy/imaging.    Time spent:30 minutes reviewing chart, interviewing and examining patient and formulating plan of care.     Chaya Jan, MD [facility]

## 2022-08-23 NOTE — Assessment & Plan Note (Signed)
Atorvastatin 20 mg started at last visit, recheck lipids today.  Goal LDL is less than 70.

## 2022-08-23 NOTE — Patient Instructions (Signed)
-  Nice seeing you today!!  -Lab work today; will notify you once results are available.  -See you back in 3-4 months. 

## 2022-12-21 ENCOUNTER — Other Ambulatory Visit (HOSPITAL_COMMUNITY): Payer: Self-pay

## 2022-12-21 ENCOUNTER — Encounter: Payer: Self-pay | Admitting: Internal Medicine

## 2022-12-21 ENCOUNTER — Ambulatory Visit: Payer: 59 | Admitting: Internal Medicine

## 2022-12-21 VITALS — BP 120/79 | HR 76 | Temp 98.8°F | Wt 217.3 lb

## 2022-12-21 DIAGNOSIS — E1169 Type 2 diabetes mellitus with other specified complication: Secondary | ICD-10-CM | POA: Diagnosis not present

## 2022-12-21 DIAGNOSIS — Z794 Long term (current) use of insulin: Secondary | ICD-10-CM

## 2022-12-21 DIAGNOSIS — I10 Essential (primary) hypertension: Secondary | ICD-10-CM

## 2022-12-21 DIAGNOSIS — K219 Gastro-esophageal reflux disease without esophagitis: Secondary | ICD-10-CM

## 2022-12-21 DIAGNOSIS — E7849 Other hyperlipidemia: Secondary | ICD-10-CM | POA: Diagnosis not present

## 2022-12-21 DIAGNOSIS — E1159 Type 2 diabetes mellitus with other circulatory complications: Secondary | ICD-10-CM

## 2022-12-21 DIAGNOSIS — I152 Hypertension secondary to endocrine disorders: Secondary | ICD-10-CM

## 2022-12-21 DIAGNOSIS — E1165 Type 2 diabetes mellitus with hyperglycemia: Secondary | ICD-10-CM

## 2022-12-21 DIAGNOSIS — Z23 Encounter for immunization: Secondary | ICD-10-CM | POA: Diagnosis not present

## 2022-12-21 DIAGNOSIS — E785 Hyperlipidemia, unspecified: Secondary | ICD-10-CM

## 2022-12-21 LAB — POCT GLYCOSYLATED HEMOGLOBIN (HGB A1C): Hemoglobin A1C: 9.7 % — AB (ref 4.0–5.6)

## 2022-12-21 MED ORDER — PANTOPRAZOLE SODIUM 40 MG PO TBEC: 40.0000 mg | DELAYED_RELEASE_TABLET | Freq: Every day | ORAL | 1 refills | Status: AC

## 2022-12-21 MED ORDER — TIRZEPATIDE 5 MG/0.5ML ~~LOC~~ SOAJ
5.0000 mg | SUBCUTANEOUS | 0 refills | Status: DC
  Filled 2022-12-21: qty 2, 28d supply, fill #0

## 2022-12-21 NOTE — Progress Notes (Signed)
Established Patient Office Visit     CC/Reason for Visit: Follow-up chronic conditions  HPI: Judy Fischer is a 61 y.o. female who is coming in today for the above mentioned reasons. Past Medical History is significant for: Hypertension, hyperlipidemia, type 2 diabetes, morbid obesity, psoriasis.  She has been complaining of increased acid reflux.  She has also not been taking Mounjaro for over 2 months due to supply chain issues.  Did not think to notify us.  She is due for Tdap.   Past Medical/Surgical History: Past Medical History:  Diagnosis Date   Abnormal MRI of abdomen 08/13/2012   Tiny Liver lesions per [facility] records    Benign breast cyst in female    Need yearly mammograms, noted from [facility] Records    Bilateral breast cysts    benign, needs yearly mammograms, per [facility]    Diabetes [facility])    Diabetes mellitus without complication (HCC)    Fatty liver    Noted in records received from [facility]    GERD (gastroesophageal reflux disease)    Heart murmur    mild   Hepatic cyst    bening on MRI 2014, Per records from [place]    History of blood transfusion 1987   Hypertension    Obesity    Pneumonia    Psoriasis    Noted in records received from [facility]     Past Surgical History:  Procedure Laterality Date   ABDOMINAL HYSTERECTOMY  2011   ovaries removed   COLONOSCOPY WITH PROPOFOL N/A 06/26/2012   Procedure: COLONOSCOPY WITH PROPOFOL;  Surgeon: Charolett Bumpers, MD;  [facility];  Service: Endoscopy;  Laterality: N/A;   surgery for ectopic pregnancy  1987   TONSILLECTOMY  as child    Social History:  reports that she has been smoking cigarettes. She has a 43 pack-year smoking history. She has never used smokeless tobacco. She reports that she does not drink alcohol and does not use drugs.  Allergies: Allergies  Allergen Reactions   Metformin And Related Nausea Only    Family History:   Family History  Problem Relation Age of Onset   Diabetes Other    Hypertension Other    Diabetes Mother    Hypertension Mother    Heart disease Mother    Cancer Father    Diabetes Brother    Diabetes Brother      Current Outpatient Medications:    Albuterol Sulfate (PROAIR RESPICLICK) 108 (90 Base) MCG/ACT AEPB, Inhale 1 puff into the lungs every 6 (six) hours as needed., Disp: 1 each, Rfl: 1   aspirin 81 MG tablet, Take 81 mg by mouth daily., Disp: , Rfl:    atorvastatin (LIPITOR) 20 MG tablet, TAKE 1 TABLET BY MOUTH EVERY DAY, Disp: 90 tablet, Rfl: 1   Continuous Blood Gluc Receiver (FREESTYLE LIBRE 14 DAY READER) DEVI, 1 Units by Does not apply route once a week., Disp: 1 Device, Rfl: 0   Continuous Blood Gluc Sensor (FREESTYLE LIBRE 14 DAY SENSOR) MISC, USE EVERY 14 DAYS AS DIRECTED Dx E11.9, Disp: 2 each, Rfl: 6   diphenhydramine-acetaminophen (TYLENOL PM) 25-500 MG TABS tablet, Take 1 tablet by mouth at bedtime as needed (sleep). , Disp: , Rfl:    empagliflozin (JARDIANCE) 10 MG TABS tablet, TAKE 1 TABLET BY MOUTH EVERY DAY, Disp: 90 tablet, Rfl: 1   fluticasone (FLONASE) 50 MCG/ACT nasal spray, Place 1 spray into both nostrils daily., Disp: 16  g, Rfl: 3   hydrochlorothiazide (HYDRODIURIL) 12.5 MG tablet, TAKE 1 TABLET BY MOUTH EVERY DAY, Disp: 90 tablet, Rfl: 1   SYMBICORT 160-4.5 MCG/ACT inhaler, Inhale 2 puffs into the lungs 2 (two) times daily. FOR COPD, Disp: 1 each, Rfl: 5   pantoprazole (PROTONIX) 40 MG tablet, Take 1 tablet (40 mg total) by mouth daily. TAKE 1 TABLET BY MOUTH EVERY DAY BEFORE BREAKFAST, Disp: 90 tablet, Rfl: 1   tirzepatide (MOUNJARO) 5 MG/0.5ML Pen, Inject 5 mg into the skin once a week., Disp: 6 mL, Rfl: 0  Current Facility-Administered Medications:    ipratropium-albuterol (DUONEB) 0.5-2.5 (3) MG/3ML nebulizer solution 3 mL, 3 mL, Nebulization, Q6H, Nche, Bonna Gains, NP, 3 mL at 04/16/18 0946  Review of Systems:  Negative unless indicated in  HPI.   Physical Exam: Vitals:   12/21/22 0711 12/21/22 0719  BP: 120/84 120/79  Pulse: 76   Temp: 98.8 F (37.1 C)   TempSrc: Oral   SpO2: 96%   Weight: 217 lb 4.8 oz (98.6 kg)     Body mass index is 38.49 kg/m.   Physical Exam Vitals reviewed.  Constitutional:      Appearance: Normal appearance.  HENT:     Head: Normocephalic and atraumatic.  Eyes:     Conjunctiva/sclera: Conjunctivae normal.     Pupils: Pupils are equal, round, and reactive to light.  Cardiovascular:     Rate and Rhythm: Normal rate and regular rhythm.  Pulmonary:     Effort: Pulmonary effort is normal.     Breath sounds: Normal breath sounds.  Skin:    General: Skin is warm and dry.  Neurological:     General: No focal deficit present.     Mental Status: She is alert and oriented to person, place, and time.  Psychiatric:        Mood and Affect: Mood normal.        Behavior: Behavior normal.        Thought Content: Thought content normal.        Judgment: Judgment normal.      Impression and Plan:  Type 2 diabetes mellitus with other specified complication, with long-term current use of insulin (HCC) Assessment & Plan: Very uncontrolled with an A1c today of 9.7, this is unsurprising given she has not been on tears appetite for over 2 months.  Resend prescription today to [facility] outpatient pharmacy.  Orders: -     POCT glycosylated hemoglobin (Hb A1C) -     Tirzepatide; Inject 5 mg into the skin once a week.  Dispense: 6 mL; Refill: 0  Hyperlipidemia associated with type 2 diabetes mellitus (HCC) Assessment & Plan: Well-controlled on atorvastatin 20 mg.   Hypertension associated with diabetes [facility]) Assessment & Plan: Well-controlled on current.   Gastroesophageal reflux disease without esophagitis Assessment & Plan: Not well-controlled, increase pantoprazole from 20 to 40 mg daily.  Orders: -     Pantoprazole Sodium; Take 1 tablet (40 mg total) by mouth daily. TAKE 1 TABLET  BY MOUTH EVERY DAY BEFORE BREAKFAST  Dispense: 90 tablet; Refill: 1  Immunization due   -Tdap administered in office today.  Time spent:32 minutes reviewing chart, interviewing and examining patient and formulating plan of care.     Chaya Jan, MD [facility] at [facility]

## 2022-12-21 NOTE — Assessment & Plan Note (Signed)
Well controlled on current

## 2022-12-21 NOTE — Assessment & Plan Note (Signed)
Not well-controlled, increase pantoprazole from 20 to 40 mg daily.

## 2022-12-21 NOTE — Assessment & Plan Note (Signed)
Very uncontrolled with an A1c today of 9.7, this is unsurprising given she has not been on tears appetite for over 2 months.  Resend prescription today to [facility] outpatient pharmacy.

## 2022-12-21 NOTE — Addendum Note (Signed)
Addended by: Kern Reap B on: 12/21/2022 08:12 AM   Modules accepted: Orders

## 2022-12-21 NOTE — Assessment & Plan Note (Signed)
Well controlled on atorvastatin 20 mg

## 2022-12-30 ENCOUNTER — Other Ambulatory Visit: Payer: Self-pay | Admitting: Internal Medicine

## 2022-12-30 DIAGNOSIS — E1169 Type 2 diabetes mellitus with other specified complication: Secondary | ICD-10-CM

## 2023-01-30 ENCOUNTER — Other Ambulatory Visit (HOSPITAL_COMMUNITY): Payer: Self-pay

## 2023-01-30 MED ORDER — MOUNJARO 5 MG/0.5ML ~~LOC~~ SOAJ
5.0000 mg | SUBCUTANEOUS | 2 refills | Status: DC
  Filled 2023-01-30: qty 2, 28d supply, fill #0

## 2023-01-31 ENCOUNTER — Ambulatory Visit (AMBULATORY_SURGERY_CENTER): Payer: 59 | Admitting: *Deleted

## 2023-01-31 ENCOUNTER — Telehealth: Payer: Self-pay | Admitting: *Deleted

## 2023-01-31 VITALS — Ht 62.5 in | Wt 225.0 lb

## 2023-01-31 DIAGNOSIS — Z1211 Encounter for screening for malignant neoplasm of colon: Secondary | ICD-10-CM

## 2023-01-31 MED ORDER — NA SULFATE-K SULFATE-MG SULF 17.5-3.13-1.6 GM/177ML PO SOLN
1.0000 | Freq: Once | ORAL | 0 refills | Status: AC
Start: 2023-01-31 — End: 2023-01-31

## 2023-01-31 NOTE — Telephone Encounter (Signed)
During PV call phon call was dropped. Pt was able to hear RN at the start of education but at the very end call ended, LM with pt that a set of instructions are in my chart anda hard copy will go out in the mail tomorrow and if she has any questions gave call back # for her todo so.

## 2023-01-31 NOTE — Progress Notes (Signed)
Pt's name and DOB verified at the beginning of the pre-visit wit 2 identifiers  Pt denies any difficulty with ambulating,sitting, laying down or rolling side to side  Pt has issues with ambulation   Pt has no issues moving head neck or swallowing  No egg or soy allergy known to patient   No issues known to pt with past sedation with any surgeries or procedures  Pt denies having issues being intubated  No FH of Malignant Hyperthermia  Pt is not on diet pills or shots  Pt is not on home 02   Pt is not on blood thinners   Pt denies issues with constipation   Pt is not on dialysis  Pt denise any abnormal heart rhythms   Pt denies any upcoming cardiac testing  Pt encouraged to use to use Singlecare or Goodrx to reduce cost   Patient's chart reviewed by Cathlyn Parsons CNRA prior to pre-visit and patient appropriate for the [facility].  Pre-visit completed and red dot placed by patient's name on their procedure day (on provider's schedule).  .  Visit by phone  Pt states weight is 225 lb  Instructed pt why it is important to and  to call if they have any changes in health or new medications. Directed them to the # given and on instructions.     Instructions reviewed. Pt given both [facility] main # and MD on call # prior to instructions.  Pt states understanding. Instructed to review again prior to procedure. Pt states they will.   Instructions sent by mail with coupon and by My Chart  Coupon sent via text to mobile phone and pt verified they received it

## 2023-02-01 ENCOUNTER — Other Ambulatory Visit (HOSPITAL_COMMUNITY): Payer: Self-pay

## 2023-02-07 ENCOUNTER — Encounter: Payer: Self-pay | Admitting: Internal Medicine

## 2023-02-19 ENCOUNTER — Other Ambulatory Visit: Payer: Self-pay | Admitting: Internal Medicine

## 2023-02-19 DIAGNOSIS — I152 Hypertension secondary to endocrine disorders: Secondary | ICD-10-CM

## 2023-02-19 DIAGNOSIS — K219 Gastro-esophageal reflux disease without esophagitis: Secondary | ICD-10-CM

## 2023-02-19 DIAGNOSIS — E1169 Type 2 diabetes mellitus with other specified complication: Secondary | ICD-10-CM

## 2023-02-20 ENCOUNTER — Ambulatory Visit (AMBULATORY_SURGERY_CENTER): Payer: 59 | Admitting: Internal Medicine

## 2023-02-20 ENCOUNTER — Encounter: Payer: Self-pay | Admitting: Internal Medicine

## 2023-02-20 VITALS — BP 119/73 | HR 91 | Temp 98.2°F | Ht 62.5 in | Wt 225.0 lb

## 2023-02-20 DIAGNOSIS — Z1211 Encounter for screening for malignant neoplasm of colon: Secondary | ICD-10-CM

## 2023-02-20 MED ORDER — SODIUM CHLORIDE 0.9 % IV SOLN: 500.0000 mL | INTRAVENOUS | Status: AC

## 2023-02-20 NOTE — Progress Notes (Signed)
Pt's states no medical or surgical changes since previsit or office visit. 

## 2023-02-20 NOTE — Progress Notes (Signed)
Patient didn't hold her Judy Fischer for 7 days, last took it on 02/15/23.  Patient stated she forgot and didn't look at her instructions until 02/15/23.    I offered to reschedule patient today but she requested to call back to schedule after she looks at her schedule.

## 2023-02-28 ENCOUNTER — Other Ambulatory Visit (HOSPITAL_COMMUNITY): Payer: Self-pay

## 2023-03-06 ENCOUNTER — Other Ambulatory Visit (HOSPITAL_COMMUNITY): Payer: Self-pay

## 2023-03-06 ENCOUNTER — Other Ambulatory Visit: Payer: Self-pay | Admitting: Internal Medicine

## 2023-03-06 DIAGNOSIS — Z794 Long term (current) use of insulin: Secondary | ICD-10-CM

## 2023-03-06 NOTE — Telephone Encounter (Signed)
Copied from CRM [id number]. Topic: Clinical - Medication Refill >> Mar 06, 2023  3:09 PM Sim Boast F wrote: Most Recent Primary Care Visit:  Provider: Henderson Cloud  Department: [facility]  Visit Type: OFFICE VISIT  Date: 12/21/2022  Medication: tirzepatide   Has the patient contacted their pharmacy? Yes (Agent: If no, request that the patient contact the pharmacy for the refill. If patient does not wish to contact the pharmacy document the reason why and proceed with request.) (Agent: If yes, when and what did the pharmacy advise?)  Is this the correct pharmacy for this prescription? Yes If no, delete pharmacy and type the correct one.  This is the patient's preferred pharmacy:    [place] [facility] [address] [place] [address] Phone: [phone] Fax: [phone]   Has the prescription been filled recently? Yes  Is the patient out of the medication? Yes  Has the patient been seen for an appointment in the last year OR does the patient have an upcoming appointment? Yes  Can we respond through MyChart? Yes  Agent: Please be advised that Rx refills may take up to 3 business days. We ask that you follow-up with your pharmacy.

## 2023-03-09 ENCOUNTER — Telehealth: Payer: Self-pay

## 2023-03-09 ENCOUNTER — Encounter (HOSPITAL_COMMUNITY): Payer: Self-pay

## 2023-03-09 ENCOUNTER — Other Ambulatory Visit: Payer: Self-pay | Admitting: Internal Medicine

## 2023-03-09 ENCOUNTER — Other Ambulatory Visit (HOSPITAL_COMMUNITY): Payer: Self-pay

## 2023-03-09 DIAGNOSIS — E1169 Type 2 diabetes mellitus with other specified complication: Secondary | ICD-10-CM

## 2023-03-09 MED ORDER — MOUNJARO 5 MG/0.5ML ~~LOC~~ SOAJ: 5.0000 mg | SUBCUTANEOUS | 2 refills | Status: DC

## 2023-03-09 NOTE — Telephone Encounter (Signed)
 Copied from CRM [id number]. Topic: Clinical - Medication Refill >> Mar 06, 2023  3:09 PM Viola F wrote: Most Recent Primary Care Visit:  Provider: THEOPHILUS DELMA TULLY CINDERELLA  Department: [facility]  Visit Type: OFFICE VISIT  Date: 12/21/2022  Medication: tirzepatide    Has the patient contacted their pharmacy? Yes, pharmacy told her it was cancelled by someone   (Agent: If no, request that the patient contact the pharmacy for the refill. If patient does not wish to contact the pharmacy document the reason why and proceed with request.) (Agent: If yes, when and what did the pharmacy advise?)  Is this the correct pharmacy for this prescription? Yes If no, delete pharmacy and type the correct one.  This is the patient's preferred pharmacy:    [place] [facility] [address] [place] [address] Phone: [phone] Fax: [phone]   Has the prescription been filled recently? Yes  Is the patient out of the medication? Yes  Has the patient been seen for an appointment in the last year OR does the patient have an upcoming appointment? Yes in January 2025  Can we respond through MyChart? Yes  Agent: Please be advised that Rx refills may take up to 3 business days. We ask that you follow-up with your pharmacy.

## 2023-03-09 NOTE — Addendum Note (Signed)
 Addended by: Henderson Cloud on: 03/09/2023 03:45 PM   Modules accepted: Orders

## 2023-03-23 ENCOUNTER — Other Ambulatory Visit (HOSPITAL_COMMUNITY): Payer: Self-pay

## 2023-03-23 ENCOUNTER — Encounter: Payer: Self-pay | Admitting: Internal Medicine

## 2023-03-23 ENCOUNTER — Ambulatory Visit (INDEPENDENT_AMBULATORY_CARE_PROVIDER_SITE_OTHER): Payer: 59 | Admitting: Internal Medicine

## 2023-03-23 VITALS — BP 129/75 | HR 78 | Temp 98.6°F | Wt 217.9 lb

## 2023-03-23 DIAGNOSIS — E1169 Type 2 diabetes mellitus with other specified complication: Secondary | ICD-10-CM

## 2023-03-23 DIAGNOSIS — Z7985 Long-term (current) use of injectable non-insulin antidiabetic drugs: Secondary | ICD-10-CM | POA: Diagnosis not present

## 2023-03-23 DIAGNOSIS — Z7984 Long term (current) use of oral hypoglycemic drugs: Secondary | ICD-10-CM

## 2023-03-23 DIAGNOSIS — I152 Hypertension secondary to endocrine disorders: Secondary | ICD-10-CM

## 2023-03-23 DIAGNOSIS — Z794 Long term (current) use of insulin: Secondary | ICD-10-CM | POA: Diagnosis not present

## 2023-03-23 DIAGNOSIS — E1159 Type 2 diabetes mellitus with other circulatory complications: Secondary | ICD-10-CM

## 2023-03-23 DIAGNOSIS — E119 Type 2 diabetes mellitus without complications: Secondary | ICD-10-CM

## 2023-03-23 DIAGNOSIS — E785 Hyperlipidemia, unspecified: Secondary | ICD-10-CM | POA: Diagnosis not present

## 2023-03-23 LAB — POCT GLYCOSYLATED HEMOGLOBIN (HGB A1C)

## 2023-03-23 MED ORDER — EMPAGLIFLOZIN 10 MG PO TABS: 10.0000 mg | ORAL_TABLET | Freq: Every day | ORAL | 1 refills | Status: DC

## 2023-03-23 MED ORDER — MOUNJARO 5 MG/0.5ML ~~LOC~~ SOAJ
5.0000 mg | SUBCUTANEOUS | 2 refills | Status: DC
  Filled 2023-03-23: qty 2, 28d supply, fill #0
  Filled 2023-05-05: qty 2, 28d supply, fill #1
  Filled 2023-05-30 – 2023-06-09 (×2): qty 2, 28d supply, fill #2

## 2023-03-23 NOTE — Assessment & Plan Note (Signed)
Well controlled on current

## 2023-03-23 NOTE — Progress Notes (Signed)
Established Patient Office Visit     CC/Reason for Visit: 60-month follow-up chronic medical conditions  HPI: Judy Fischer is a 63 y.o. female who is coming in today for the above mentioned reasons. Past Medical History is significant for: Hypertension, hyperlipidemia, type 2 diabetes, morbid obesity.  She is feeling well.  She has worked hard on lifestyle changes.  She is needing refills of Mounjaro and Jardiance.   Past Medical/Surgical History: Past Medical History:  Diagnosis Date   Abnormal MRI of abdomen 08/13/2012   Tiny Liver lesions per [facility] records    Arthritis    Benign breast cyst in female    Need yearly mammograms, noted from [facility]    Bilateral breast cysts    benign, needs yearly mammograms, per [facility]    Diabetes [facility])    Diabetes mellitus without complication (HCC)    Fatty liver    Noted in records received from [facility]    GERD (gastroesophageal reflux disease)    Heart murmur    mild   Hepatic cyst    bening on MRI 2014, Per records from [place]    History of blood transfusion 1987   Hypertension    Obesity    Pneumonia    Psoriasis    Noted in records received from [facility]     Past Surgical History:  Procedure Laterality Date   ABDOMINAL HYSTERECTOMY  03/07/2009   ovaries removed   COLONOSCOPY     COLONOSCOPY WITH PROPOFOL N/A 06/26/2012   Procedure: COLONOSCOPY WITH PROPOFOL;  Surgeon: Charolett Bumpers, MD;  Location: [facility];  Service: Endoscopy;  Laterality: N/A;   surgery for ectopic pregnancy  03/07/1985   TONSILLECTOMY  as child    Social History:  reports that she has been smoking cigarettes. She has a 43 pack-year smoking history. She has never used smokeless tobacco. She reports that she does not drink alcohol and does not use drugs.  Allergies: Allergies  Allergen Reactions   Metformin And Related Nausea Only    Family History:  Family History  Problem  Relation Age of Onset   Diabetes Mother    Hypertension Mother    Heart disease Mother    Cancer Father    Diabetes Brother    Diabetes Brother    Diabetes Other    Hypertension Other    Colon polyps Neg Hx    Colon cancer Neg Hx    Esophageal cancer Neg Hx    Stomach cancer Neg Hx    Rectal cancer Neg Hx      Current Outpatient Medications:    Albuterol Sulfate (PROAIR RESPICLICK) 108 (90 Base) MCG/ACT AEPB, Inhale 1 puff into the lungs every 6 (six) hours as needed., Disp: 1 each, Rfl: 1   aspirin 81 MG tablet, Take 81 mg by mouth daily., Disp: , Rfl:    atorvastatin (LIPITOR) 20 MG tablet, TAKE 1 TABLET BY MOUTH EVERY DAY, Disp: 90 tablet, Rfl: 0   Continuous Blood Gluc Receiver (FREESTYLE LIBRE 14 DAY READER) DEVI, 1 Units by Does not apply route once a week., Disp: 1 Device, Rfl: 0   Continuous Blood Gluc Sensor (FREESTYLE LIBRE 14 DAY SENSOR) MISC, USE EVERY 14 DAYS AS DIRECTED Dx E11.9, Disp: 2 each, Rfl: 6   diphenhydramine-acetaminophen (TYLENOL PM) 25-500 MG TABS tablet, Take 1 tablet by mouth at bedtime as needed (sleep)., Disp: , Rfl:    fluticasone (FLONASE) 50 MCG/ACT nasal spray, Place 1  spray into both nostrils daily., Disp: 16 g, Rfl: 3   hydrochlorothiazide (HYDRODIURIL) 12.5 MG tablet, TAKE 1 TABLET BY MOUTH EVERY DAY, Disp: 90 tablet, Rfl: 0   pantoprazole (PROTONIX) 40 MG tablet, Take 1 tablet (40 mg total) by mouth daily. TAKE 1 TABLET BY MOUTH EVERY DAY BEFORE BREAKFAST, Disp: 90 tablet, Rfl: 1   SYMBICORT 160-4.5 MCG/ACT inhaler, Inhale 2 puffs into the lungs 2 (two) times daily. FOR COPD, Disp: 1 each, Rfl: 5   empagliflozin (JARDIANCE) 10 MG TABS tablet, Take 1 tablet (10 mg total) by mouth daily., Disp: 90 tablet, Rfl: 1   tirzepatide (MOUNJARO) 5 MG/0.5ML Pen, Inject 5 mg into the skin once a week., Disp: 2 mL, Rfl: 2  Current Facility-Administered Medications:    ipratropium-albuterol (DUONEB) 0.5-2.5 (3) MG/3ML nebulizer solution 3 mL, 3 mL, Nebulization,  Q6H, Nche, Bonna Gains, NP, 3 mL at 04/16/18 0946  Review of Systems:  Negative unless indicated in HPI.   Physical Exam: Vitals:   03/23/23 0712  BP: 129/75  Pulse: 78  Temp: 98.6 F (37 C)  TempSrc: Oral  SpO2: 93%  Weight: 217 lb 14.4 oz (98.8 kg)    Body mass index is 39.22 kg/m.   Physical Exam Vitals reviewed.  Constitutional:      Appearance: Normal appearance. She is obese.  HENT:     Head: Normocephalic and atraumatic.  Eyes:     Conjunctiva/sclera: Conjunctivae normal.     Pupils: Pupils are equal, round, and reactive to light.  Cardiovascular:     Rate and Rhythm: Normal rate and regular rhythm.  Pulmonary:     Effort: Pulmonary effort is normal.     Breath sounds: Normal breath sounds.  Skin:    General: Skin is warm and dry.  Neurological:     General: No focal deficit present.     Mental Status: She is alert and oriented to person, place, and time.  Psychiatric:        Mood and Affect: Mood normal.        Behavior: Behavior normal.        Thought Content: Thought content normal.        Judgment: Judgment normal.      Impression and Plan:  Type 2 diabetes mellitus with other specified complication, with long-term current use of insulin (HCC) Assessment & Plan: Well-controlled with an A1c of 6.5.  Resend [place] today.  Orders: -     POCT glycosylated hemoglobin (Hb A1C) -     Microalbumin / creatinine urine ratio -     Mounjaro; Inject 5 mg into the skin once a week.  Dispense: 2 mL; Refill: 2  Type 2 diabetes mellitus without complication, with long-term current use of insulin (HCC) Assessment & Plan: Well-controlled with an A1c of 6.5.  Resend [place] today.  Orders: -     Empagliflozin; Take 1 tablet (10 mg total) by mouth daily.  Dispense: 90 tablet; Refill: 1  Hyperlipidemia associated with type 2 diabetes mellitus (HCC) Assessment & Plan: Well-controlled on atorvastatin 20 mg.   Hypertension  associated with diabetes [facility]) Assessment & Plan: Well-controlled on current.      Time spent:32 minutes reviewing chart, interviewing and examining patient and formulating plan of care.     Chaya Jan, MD [facility] at [facility]

## 2023-03-23 NOTE — Assessment & Plan Note (Signed)
Well-controlled with an A1c of 6.5.  Resend [place] today.

## 2023-03-23 NOTE — Assessment & Plan Note (Signed)
Well controlled on atorvastatin 20 mg

## 2023-05-05 ENCOUNTER — Other Ambulatory Visit (HOSPITAL_COMMUNITY): Payer: Self-pay

## 2023-05-09 ENCOUNTER — Other Ambulatory Visit (HOSPITAL_COMMUNITY): Payer: Self-pay

## 2023-05-30 ENCOUNTER — Other Ambulatory Visit (HOSPITAL_COMMUNITY): Payer: Self-pay

## 2023-06-08 ENCOUNTER — Other Ambulatory Visit (HOSPITAL_COMMUNITY): Payer: Self-pay

## 2023-06-09 ENCOUNTER — Other Ambulatory Visit (HOSPITAL_COMMUNITY): Payer: Self-pay

## 2023-06-29 ENCOUNTER — Other Ambulatory Visit: Payer: Self-pay | Admitting: Acute Care

## 2023-06-29 DIAGNOSIS — Z122 Encounter for screening for malignant neoplasm of respiratory organs: Secondary | ICD-10-CM

## 2023-06-29 DIAGNOSIS — Z87891 Personal history of nicotine dependence: Secondary | ICD-10-CM

## 2023-06-29 DIAGNOSIS — F17210 Nicotine dependence, cigarettes, uncomplicated: Secondary | ICD-10-CM

## 2023-06-30 ENCOUNTER — Other Ambulatory Visit: Payer: Self-pay | Admitting: Internal Medicine

## 2023-06-30 DIAGNOSIS — Z794 Long term (current) use of insulin: Secondary | ICD-10-CM

## 2023-07-03 ENCOUNTER — Other Ambulatory Visit (HOSPITAL_COMMUNITY): Payer: Self-pay

## 2023-07-03 MED ORDER — MOUNJARO 5 MG/0.5ML ~~LOC~~ SOAJ
5.0000 mg | SUBCUTANEOUS | 2 refills | Status: DC
  Filled 2023-07-03: qty 2, 28d supply, fill #0
  Filled 2023-07-28: qty 2, 28d supply, fill #1
  Filled 2023-08-28 – 2023-09-19 (×4): qty 2, 28d supply, fill #2

## 2023-07-06 ENCOUNTER — Other Ambulatory Visit (HOSPITAL_COMMUNITY): Payer: Self-pay

## 2023-07-21 ENCOUNTER — Other Ambulatory Visit: Payer: Self-pay | Admitting: Internal Medicine

## 2023-07-21 DIAGNOSIS — E1169 Type 2 diabetes mellitus with other specified complication: Secondary | ICD-10-CM

## 2023-08-02 ENCOUNTER — Inpatient Hospital Stay: Admission: RE | Admit: 2023-08-02 | Source: Ambulatory Visit

## 2023-09-07 ENCOUNTER — Other Ambulatory Visit (HOSPITAL_COMMUNITY): Payer: Self-pay

## 2023-09-09 ENCOUNTER — Other Ambulatory Visit (HOSPITAL_COMMUNITY): Payer: Self-pay

## 2023-09-11 ENCOUNTER — Telehealth: Payer: Self-pay

## 2023-09-11 ENCOUNTER — Other Ambulatory Visit (HOSPITAL_COMMUNITY): Payer: Self-pay

## 2023-09-11 NOTE — Telephone Encounter (Signed)
 Pharmacy Patient Advocate Encounter   Received notification from CoverMyMeds that prior authorization for Mounjaro  5MG /0.5ML auto-injectors  is required/requested.   Insurance verification completed.   The patient is insured through [employer] .   Per test claim: PA required; PA started via CoverMyMeds. KEY [id number] . Waiting for clinical questions to populate.

## 2023-09-12 NOTE — Telephone Encounter (Signed)
 PLEASE BE ADVISED Clinical questions have been answered and PA submitted.TO PLAN. PA currently Pending.

## 2023-09-14 NOTE — Telephone Encounter (Signed)
 Pharmacy Patient Advocate Encounter  Received notification from [employer] that Prior Authorization for Mounjaro  5MG /0.5ML auto-injectors  has been APPROVED from 08/13/2023 to 09/12/2024   PA #/Case ID/Reference #: [id number]

## 2023-09-14 NOTE — Telephone Encounter (Signed)
 Vernell, this was sent to our office by mistake. Please see message. Thanks

## 2023-09-19 ENCOUNTER — Other Ambulatory Visit (HOSPITAL_COMMUNITY): Payer: Self-pay

## 2023-10-10 ENCOUNTER — Other Ambulatory Visit: Payer: Self-pay | Admitting: Internal Medicine

## 2023-10-10 DIAGNOSIS — E1159 Type 2 diabetes mellitus with other circulatory complications: Secondary | ICD-10-CM

## 2023-10-13 ENCOUNTER — Other Ambulatory Visit (HOSPITAL_COMMUNITY): Payer: Self-pay

## 2023-10-13 ENCOUNTER — Other Ambulatory Visit: Payer: Self-pay | Admitting: Internal Medicine

## 2023-10-13 DIAGNOSIS — Z794 Long term (current) use of insulin: Secondary | ICD-10-CM

## 2023-10-13 MED ORDER — MOUNJARO 5 MG/0.5ML ~~LOC~~ SOAJ
5.0000 mg | SUBCUTANEOUS | 0 refills | Status: DC
Start: 2023-10-13 — End: 2023-11-14
  Filled 2023-10-17: qty 2, 28d supply, fill #0

## 2023-10-17 ENCOUNTER — Other Ambulatory Visit (HOSPITAL_COMMUNITY): Payer: Self-pay

## 2023-11-14 ENCOUNTER — Other Ambulatory Visit (HOSPITAL_COMMUNITY): Payer: Self-pay

## 2023-11-14 ENCOUNTER — Other Ambulatory Visit: Payer: Self-pay | Admitting: Internal Medicine

## 2023-11-14 DIAGNOSIS — E1169 Type 2 diabetes mellitus with other specified complication: Secondary | ICD-10-CM

## 2023-11-14 MED ORDER — MOUNJARO 5 MG/0.5ML ~~LOC~~ SOAJ
5.0000 mg | SUBCUTANEOUS | 0 refills | Status: DC
  Filled 2024-01-02: qty 2, 28d supply, fill #0

## 2024-01-02 ENCOUNTER — Other Ambulatory Visit (HOSPITAL_COMMUNITY): Payer: Self-pay

## 2024-01-10 ENCOUNTER — Other Ambulatory Visit: Payer: Self-pay | Admitting: Internal Medicine

## 2024-01-10 DIAGNOSIS — I152 Hypertension secondary to endocrine disorders: Secondary | ICD-10-CM

## 2024-01-29 ENCOUNTER — Other Ambulatory Visit (HOSPITAL_COMMUNITY): Payer: Self-pay

## 2024-01-29 ENCOUNTER — Other Ambulatory Visit: Payer: Self-pay | Admitting: Internal Medicine

## 2024-01-29 DIAGNOSIS — E1169 Type 2 diabetes mellitus with other specified complication: Secondary | ICD-10-CM

## 2024-01-29 MED ORDER — MOUNJARO 5 MG/0.5ML ~~LOC~~ SOAJ
5.0000 mg | SUBCUTANEOUS | 0 refills | Status: DC
  Filled 2024-02-20: qty 2, 28d supply, fill #0

## 2024-02-20 ENCOUNTER — Other Ambulatory Visit (HOSPITAL_COMMUNITY): Payer: Self-pay

## 2024-03-08 ENCOUNTER — Other Ambulatory Visit: Payer: Self-pay | Admitting: Internal Medicine

## 2024-03-08 DIAGNOSIS — Z794 Long term (current) use of insulin: Secondary | ICD-10-CM

## 2024-03-28 ENCOUNTER — Other Ambulatory Visit: Payer: Self-pay | Admitting: Internal Medicine

## 2024-03-28 ENCOUNTER — Telehealth: Payer: Self-pay | Admitting: Internal Medicine

## 2024-03-28 ENCOUNTER — Other Ambulatory Visit (HOSPITAL_COMMUNITY): Payer: Self-pay

## 2024-03-28 DIAGNOSIS — E1169 Type 2 diabetes mellitus with other specified complication: Secondary | ICD-10-CM

## 2024-03-28 MED ORDER — MOUNJARO 7.5 MG/0.5ML ~~LOC~~ SOAJ: 7.5000 mg | SUBCUTANEOUS | 0 refills | Status: DC

## 2024-03-28 NOTE — Telephone Encounter (Signed)
 Spoke to the patient and she would like to increase her dosage.

## 2024-03-29 ENCOUNTER — Other Ambulatory Visit (HOSPITAL_COMMUNITY): Payer: Self-pay

## 2024-03-29 ENCOUNTER — Encounter (HOSPITAL_COMMUNITY): Payer: Self-pay

## 2024-04-02 ENCOUNTER — Telehealth: Payer: Self-pay

## 2024-04-02 NOTE — Telephone Encounter (Signed)
 Copied from CRM #[id number]. Topic: Clinical - Medication Question >> Apr 02, 2024  3:27 PM Judy Fischer wrote: Reason for CRM: Pt is requesting to speak with Rachel,CMA, in regards to the tirzepatide  (MOUNJARO ) 7.5 MG/0.5ML Pen. Pt stated that she was previously taking 5 MG of this medication but was increased to 7.5 MG. Pt stated that this increase took her copay from $25 to $75 and wants to know if she can go back to the 5 MG tablet. Pt would like a callback with an update.

## 2024-04-05 ENCOUNTER — Other Ambulatory Visit: Payer: Self-pay | Admitting: Internal Medicine

## 2024-04-05 DIAGNOSIS — E119 Type 2 diabetes mellitus without complications: Secondary | ICD-10-CM

## 2024-04-11 ENCOUNTER — Other Ambulatory Visit (HOSPITAL_COMMUNITY): Payer: Self-pay

## 2024-04-11 MED ORDER — TIRZEPATIDE 5 MG/0.5ML ~~LOC~~ SOAJ
5.0000 mg | SUBCUTANEOUS | 0 refills | Status: AC
  Filled 2024-04-11: qty 2, 28d supply, fill #0

## 2024-04-11 NOTE — Telephone Encounter (Deleted)
 Spoke to the patient and she would like her dosage to stay at 5 mg.  Rx sent.

## 2024-04-11 NOTE — Telephone Encounter (Signed)
 Patient would like a refill of Mounjaro  5 mg.  She states that the 7.5 mg will cost $50 more.  I explained that it may need a PA.  Patient requests a refill of 5 mg.  Refill sent.

## 2024-04-11 NOTE — Addendum Note (Signed)
 Addended by: KATHRYNE MILLMAN B on: 04/11/2024 01:57 PM   Modules accepted: Orders
